# Patient Record
Sex: Female | Born: 1955 | Race: White | Hispanic: No | Marital: Married | Smoking: Former smoker
Health system: Southern US, Community
[De-identification: ages and names within clinical notes are randomized; demographics above are authoritative.]

## PROBLEM LIST (undated history)

## (undated) DIAGNOSIS — F17210 Nicotine dependence, cigarettes, uncomplicated: Secondary | ICD-10-CM

## (undated) DIAGNOSIS — Z5189 Encounter for other specified aftercare: Secondary | ICD-10-CM

## (undated) DIAGNOSIS — D649 Anemia, unspecified: Secondary | ICD-10-CM

## (undated) DIAGNOSIS — N200 Calculus of kidney: Secondary | ICD-10-CM

## (undated) DIAGNOSIS — T7840XA Allergy, unspecified, initial encounter: Secondary | ICD-10-CM

## (undated) DIAGNOSIS — Z87442 Personal history of urinary calculi: Secondary | ICD-10-CM

## (undated) DIAGNOSIS — Z87891 Personal history of nicotine dependence: Secondary | ICD-10-CM

## (undated) DIAGNOSIS — E785 Hyperlipidemia, unspecified: Secondary | ICD-10-CM

## (undated) DIAGNOSIS — E039 Hypothyroidism, unspecified: Secondary | ICD-10-CM

## (undated) HISTORY — DX: Hypothyroidism, unspecified: E03.9

## (undated) HISTORY — PX: CHOLECYSTECTOMY: SHX55

## (undated) HISTORY — PX: TUBAL LIGATION: SHX77

## (undated) HISTORY — DX: Encounter for other specified aftercare: Z51.89

## (undated) HISTORY — DX: Allergy, unspecified, initial encounter: T78.40XA

## (undated) HISTORY — PX: CHOLECYSTECTOMY, LAPAROSCOPIC: SHX56

## (undated) HISTORY — DX: Nicotine dependence, cigarettes, uncomplicated: F17.210

## (undated) HISTORY — PX: ABDOMINAL HYSTERECTOMY: SHX81

## (undated) HISTORY — DX: Calculus of kidney: N20.0

---

## 2015-05-26 DIAGNOSIS — Z79890 Hormone replacement therapy (postmenopausal): Secondary | ICD-10-CM | POA: Insufficient documentation

## 2015-05-26 DIAGNOSIS — N200 Calculus of kidney: Secondary | ICD-10-CM | POA: Insufficient documentation

## 2015-05-26 DIAGNOSIS — E039 Hypothyroidism, unspecified: Secondary | ICD-10-CM | POA: Insufficient documentation

## 2015-05-29 DIAGNOSIS — Z0000 Encounter for general adult medical examination without abnormal findings: Secondary | ICD-10-CM | POA: Insufficient documentation

## 2017-07-07 DIAGNOSIS — R3129 Other microscopic hematuria: Secondary | ICD-10-CM | POA: Diagnosis not present

## 2017-07-07 DIAGNOSIS — Z0000 Encounter for general adult medical examination without abnormal findings: Secondary | ICD-10-CM | POA: Diagnosis not present

## 2018-05-15 ENCOUNTER — Other Ambulatory Visit: Payer: Self-pay | Admitting: Internal Medicine

## 2018-05-15 DIAGNOSIS — Z1389 Encounter for screening for other disorder: Secondary | ICD-10-CM | POA: Diagnosis not present

## 2018-05-15 DIAGNOSIS — F17210 Nicotine dependence, cigarettes, uncomplicated: Secondary | ICD-10-CM | POA: Diagnosis not present

## 2018-05-15 DIAGNOSIS — N200 Calculus of kidney: Secondary | ICD-10-CM | POA: Diagnosis not present

## 2018-05-15 DIAGNOSIS — Z0000 Encounter for general adult medical examination without abnormal findings: Secondary | ICD-10-CM | POA: Diagnosis not present

## 2018-05-15 DIAGNOSIS — Z23 Encounter for immunization: Secondary | ICD-10-CM | POA: Diagnosis not present

## 2018-05-15 DIAGNOSIS — E039 Hypothyroidism, unspecified: Secondary | ICD-10-CM | POA: Diagnosis not present

## 2018-05-15 DIAGNOSIS — Z1231 Encounter for screening mammogram for malignant neoplasm of breast: Secondary | ICD-10-CM

## 2018-05-15 LAB — LIPID PANEL
Cholesterol: 232 — AB (ref 0–200)
HDL: 62 (ref 35–70)
LDL Cholesterol: 131
LDl/HDL Ratio: 3.7
Triglycerides: 189 — AB (ref 40–160)

## 2018-05-15 LAB — BASIC METABOLIC PANEL
BUN: 9 (ref 4–21)
CO2: 27 — AB (ref 13–22)
Chloride: 104 (ref 99–108)
Creatinine: 0.7 (ref 0.5–1.1)
Glucose: 91
Potassium: 4.9 (ref 3.4–5.3)
Sodium: 141 (ref 137–147)

## 2018-05-15 LAB — VITAMIN D 25 HYDROXY (VIT D DEFICIENCY, FRACTURES): Vit D, 25-Hydroxy: 22.3

## 2018-05-15 LAB — CBC: RBC: 4.23 (ref 3.87–5.11)

## 2018-05-15 LAB — CBC AND DIFFERENTIAL
HCT: 40 (ref 36–46)
Hemoglobin: 13.7 (ref 12.0–16.0)
Platelets: 266 (ref 150–399)
WBC: 6.5

## 2018-05-15 LAB — COMPREHENSIVE METABOLIC PANEL
Albumin: 4.5 (ref 3.5–5.0)
Calcium: 10.2 (ref 8.7–10.7)
GFR calc non Af Amer: 88

## 2018-05-15 LAB — TSH: TSH: 0.12 — AB (ref 0.41–5.90)

## 2018-05-15 LAB — HEPATIC FUNCTION PANEL
ALT: 14 (ref 7–35)
AST: 21 (ref 13–35)

## 2018-05-18 ENCOUNTER — Other Ambulatory Visit: Payer: Self-pay | Admitting: Internal Medicine

## 2018-05-18 DIAGNOSIS — Z1231 Encounter for screening mammogram for malignant neoplasm of breast: Secondary | ICD-10-CM

## 2018-06-22 DIAGNOSIS — E039 Hypothyroidism, unspecified: Secondary | ICD-10-CM | POA: Diagnosis not present

## 2018-07-27 ENCOUNTER — Ambulatory Visit
Admission: RE | Admit: 2018-07-27 | Discharge: 2018-07-27 | Disposition: A | Discharge status: Home / Self Care | Payer: BLUE CROSS/BLUE SHIELD | Source: Ambulatory Visit | Attending: Internal Medicine | Admitting: Internal Medicine

## 2018-07-27 DIAGNOSIS — Z1231 Encounter for screening mammogram for malignant neoplasm of breast: Secondary | ICD-10-CM

## 2018-08-31 DIAGNOSIS — E039 Hypothyroidism, unspecified: Secondary | ICD-10-CM | POA: Diagnosis not present

## 2019-05-21 DIAGNOSIS — E559 Vitamin D deficiency, unspecified: Secondary | ICD-10-CM | POA: Diagnosis not present

## 2019-05-21 DIAGNOSIS — Z1389 Encounter for screening for other disorder: Secondary | ICD-10-CM | POA: Diagnosis not present

## 2019-05-21 DIAGNOSIS — Z23 Encounter for immunization: Secondary | ICD-10-CM | POA: Diagnosis not present

## 2019-05-21 DIAGNOSIS — Z131 Encounter for screening for diabetes mellitus: Secondary | ICD-10-CM | POA: Diagnosis not present

## 2019-05-21 DIAGNOSIS — E039 Hypothyroidism, unspecified: Secondary | ICD-10-CM | POA: Diagnosis not present

## 2019-05-21 DIAGNOSIS — Z0000 Encounter for general adult medical examination without abnormal findings: Secondary | ICD-10-CM | POA: Diagnosis not present

## 2019-05-21 DIAGNOSIS — E7800 Pure hypercholesterolemia, unspecified: Secondary | ICD-10-CM | POA: Diagnosis not present

## 2019-10-29 DIAGNOSIS — Z23 Encounter for immunization: Secondary | ICD-10-CM | POA: Diagnosis not present

## 2020-01-03 ENCOUNTER — Ambulatory Visit: Payer: BC Managed Care – PPO | Attending: Internal Medicine

## 2020-01-03 ENCOUNTER — Other Ambulatory Visit: Payer: Self-pay

## 2020-01-03 ENCOUNTER — Ambulatory Visit: Payer: BC Managed Care – PPO

## 2020-01-03 DIAGNOSIS — Z23 Encounter for immunization: Secondary | ICD-10-CM

## 2020-01-03 NOTE — Progress Notes (Signed)
   Covid-19 Vaccination Clinic  Name:  Sandra Hess    MRN: [id number] DOB: 29-Dec-1955  01/03/2020  [name] was observed post Covid-19 immunization for 15 minutes without incident. She was provided with Vaccine Information Sheet and instruction to access the V-Safe system.   [name] was instructed to call 911 with any severe reactions post vaccine: Marland Kitchen Difficulty breathing  . Swelling of face and throat  . A fast heartbeat  . A bad rash all over body  . Dizziness and weakness   Immunizations Administered    Name Date Dose VIS Date Route   Pfizer COVID-19 Vaccine 01/03/2020  1:04 PM 0.3 mL 09/10/2019 Intramuscular   Manufacturer: [employer], [employer]   Lot: [id number]   NDC: [address]-2

## 2020-01-19 ENCOUNTER — Other Ambulatory Visit: Payer: Self-pay | Admitting: Internal Medicine

## 2020-01-19 DIAGNOSIS — K219 Gastro-esophageal reflux disease without esophagitis: Secondary | ICD-10-CM | POA: Diagnosis not present

## 2020-01-19 DIAGNOSIS — R1013 Epigastric pain: Secondary | ICD-10-CM | POA: Diagnosis not present

## 2020-01-21 ENCOUNTER — Other Ambulatory Visit: Payer: Self-pay | Admitting: Internal Medicine

## 2020-01-21 DIAGNOSIS — R1013 Epigastric pain: Secondary | ICD-10-CM

## 2020-01-27 ENCOUNTER — Ambulatory Visit
Admission: RE | Admit: 2020-01-27 | Discharge: 2020-01-27 | Disposition: A | Discharge status: Home / Self Care | Payer: BC Managed Care – PPO | Source: Ambulatory Visit | Attending: Internal Medicine | Admitting: Internal Medicine

## 2020-01-27 DIAGNOSIS — R1013 Epigastric pain: Secondary | ICD-10-CM

## 2020-01-27 DIAGNOSIS — K219 Gastro-esophageal reflux disease without esophagitis: Secondary | ICD-10-CM | POA: Diagnosis not present

## 2020-01-27 DIAGNOSIS — K8020 Calculus of gallbladder without cholecystitis without obstruction: Secondary | ICD-10-CM | POA: Diagnosis not present

## 2020-02-02 ENCOUNTER — Ambulatory Visit: Payer: BC Managed Care – PPO | Attending: Internal Medicine

## 2020-02-02 DIAGNOSIS — Z23 Encounter for immunization: Secondary | ICD-10-CM

## 2020-02-02 NOTE — Progress Notes (Signed)
   Covid-19 Vaccination Clinic  Name:  Sandra Hess    MRN: [id number] DOB: October 15, 1955  02/02/2020  [name] was observed post Covid-19 immunization for 15 minutes without incident. She was provided with Vaccine Information Sheet and instruction to access the V-Safe system.   [name] was instructed to call 911 with any severe reactions post vaccine: Marland Kitchen Difficulty breathing  . Swelling of face and throat  . A fast heartbeat  . A bad rash all over body  . Dizziness and weakness   Immunizations Administered    Name Date Dose VIS Date Route   Pfizer COVID-19 Vaccine 02/02/2020  8:14 AM 0.3 mL 11/24/2018 Intramuscular   Manufacturer: [employer], [employer]   Lot: [id number]   NDC: [address]-7

## 2020-03-03 DIAGNOSIS — R945 Abnormal results of liver function studies: Secondary | ICD-10-CM | POA: Diagnosis not present

## 2020-05-26 DIAGNOSIS — Z131 Encounter for screening for diabetes mellitus: Secondary | ICD-10-CM | POA: Diagnosis not present

## 2020-05-26 DIAGNOSIS — E039 Hypothyroidism, unspecified: Secondary | ICD-10-CM | POA: Diagnosis not present

## 2020-05-26 DIAGNOSIS — Z0000 Encounter for general adult medical examination without abnormal findings: Secondary | ICD-10-CM | POA: Diagnosis not present

## 2020-05-26 DIAGNOSIS — N200 Calculus of kidney: Secondary | ICD-10-CM | POA: Diagnosis not present

## 2020-05-31 DIAGNOSIS — K8020 Calculus of gallbladder without cholecystitis without obstruction: Secondary | ICD-10-CM | POA: Insufficient documentation

## 2021-01-30 IMAGING — US US ABDOMEN COMPLETE
1 series · 14 of 25 positions shown · non-contrast
Comparison: None.

CLINICAL DATA: Epigastric abdominal pain.

EXAM:
ABDOMEN ULTRASOUND COMPLETE

[Series 1: us abdomen complete · 0.23mm/px · 14 of 78 slices shown]
[im 1/78]
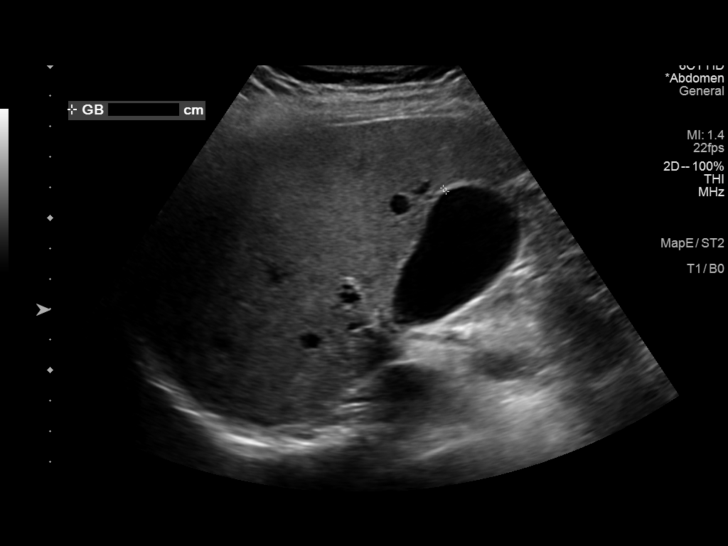
[im 7/78]
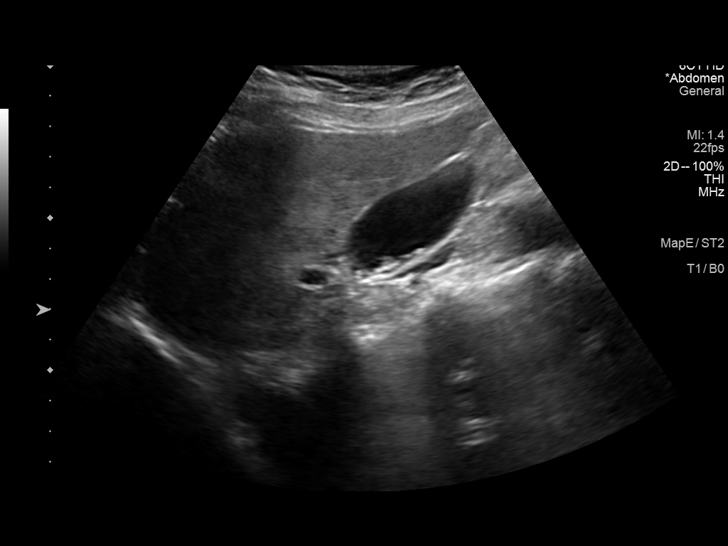
[im 13/78]
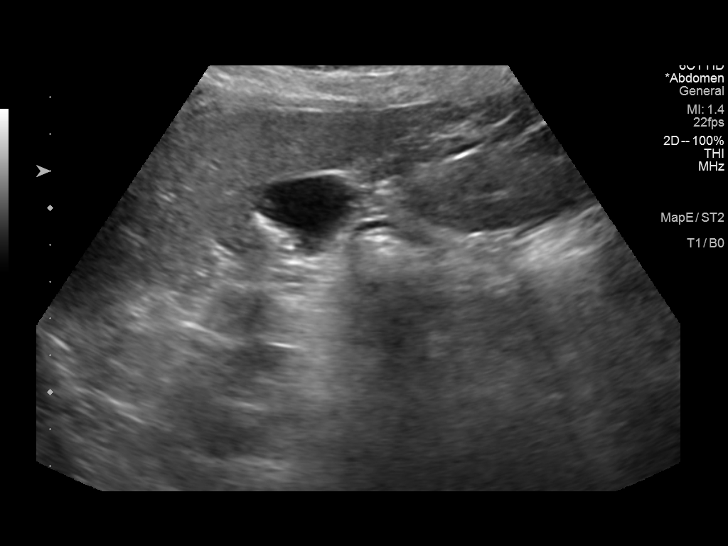
[im 20/78]
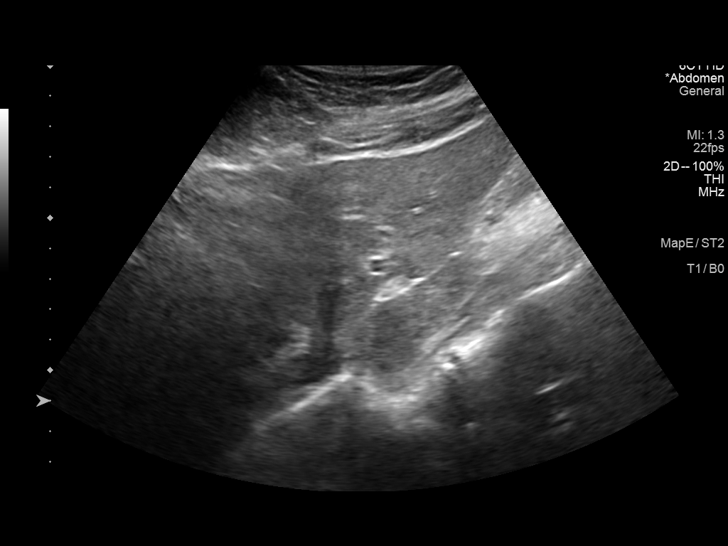
[im 26/78]
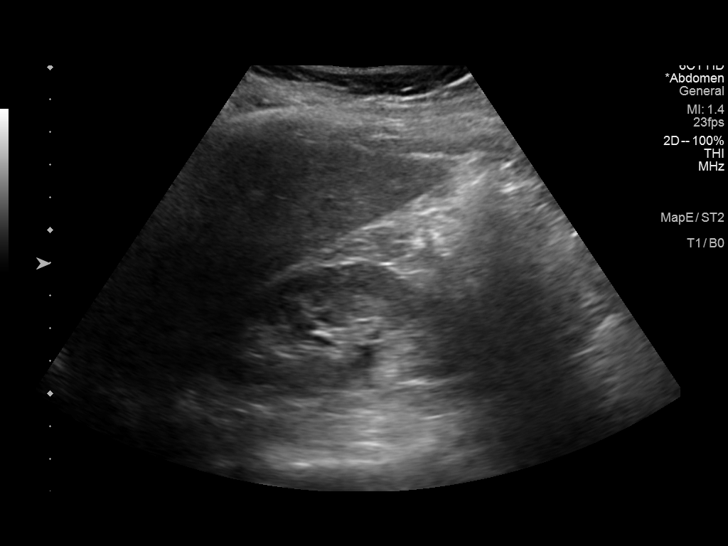
[im 29/78]
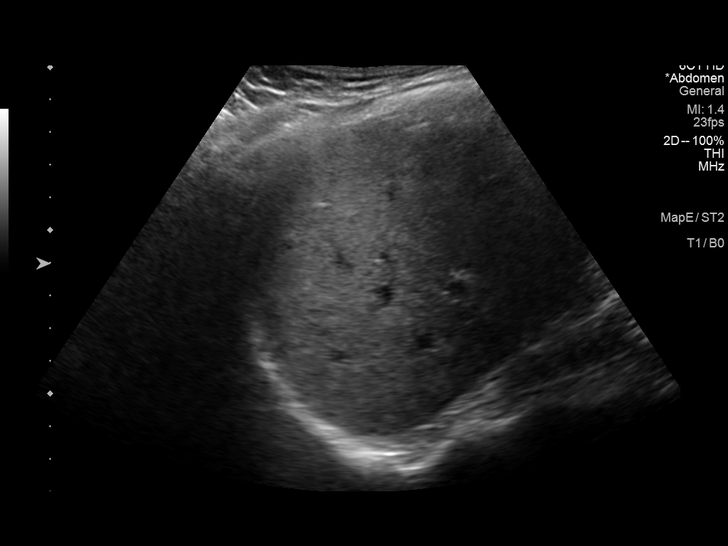
[im 36/78]
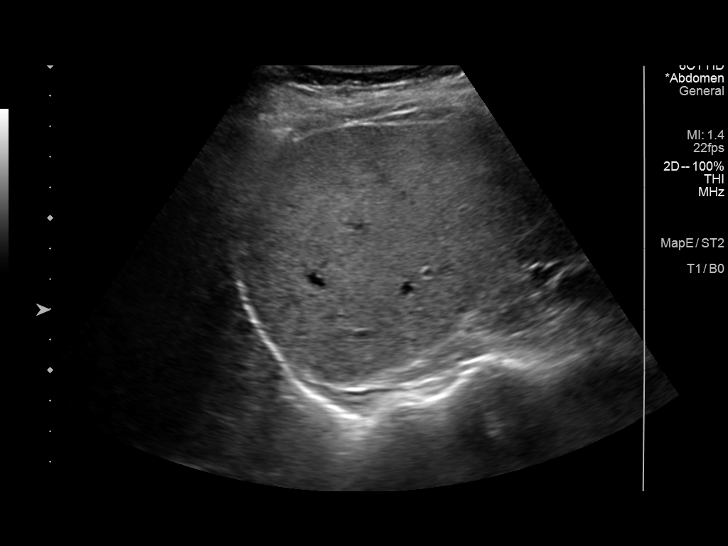
[im 42/78]
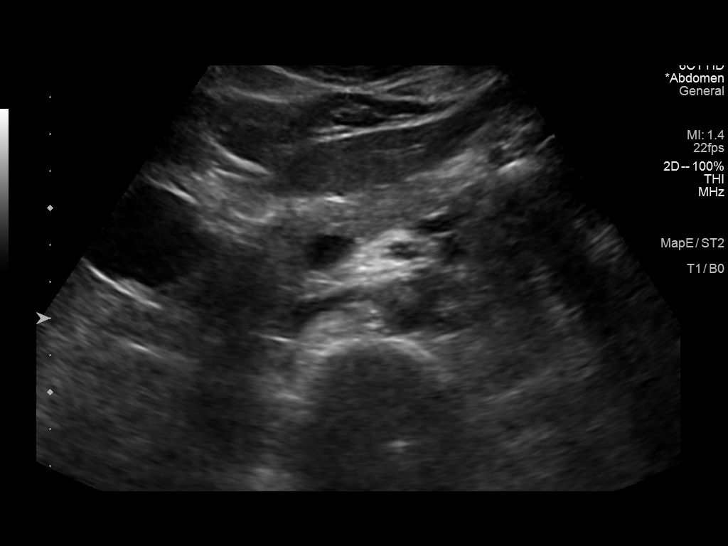
[im 49/78]
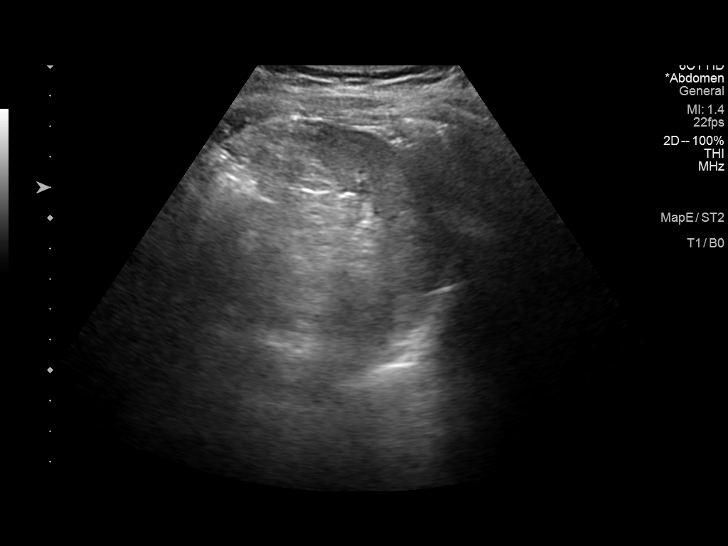
[im 52/78]
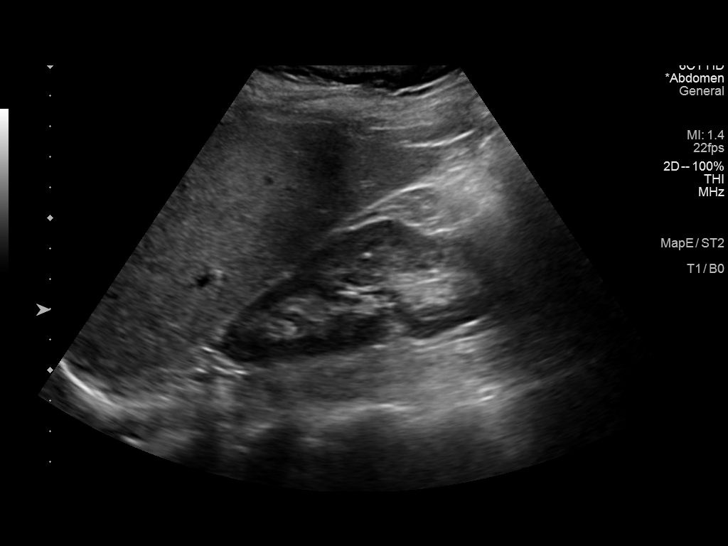
[im 58/78]
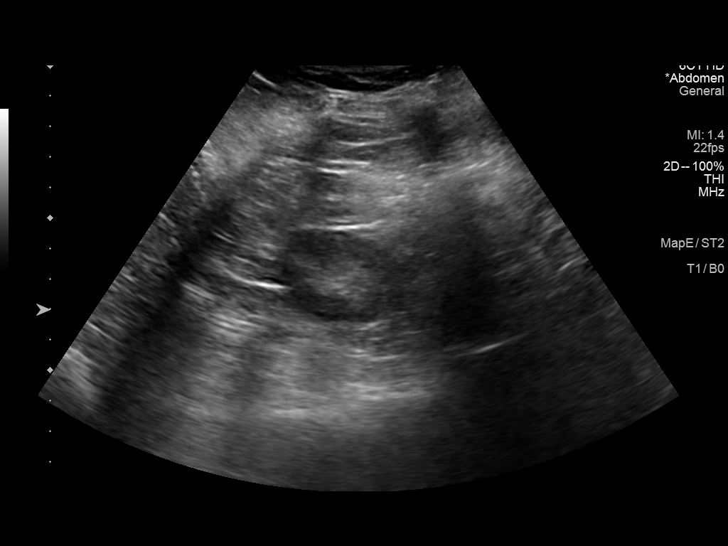
[im 65/78]
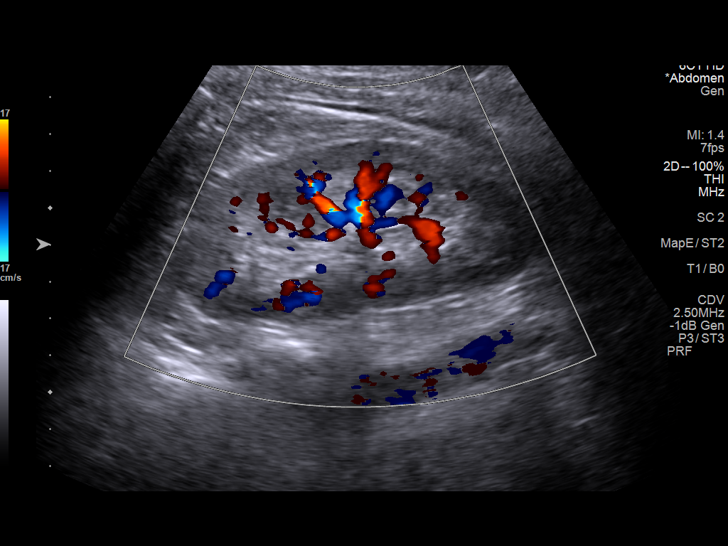
[im 71/78]
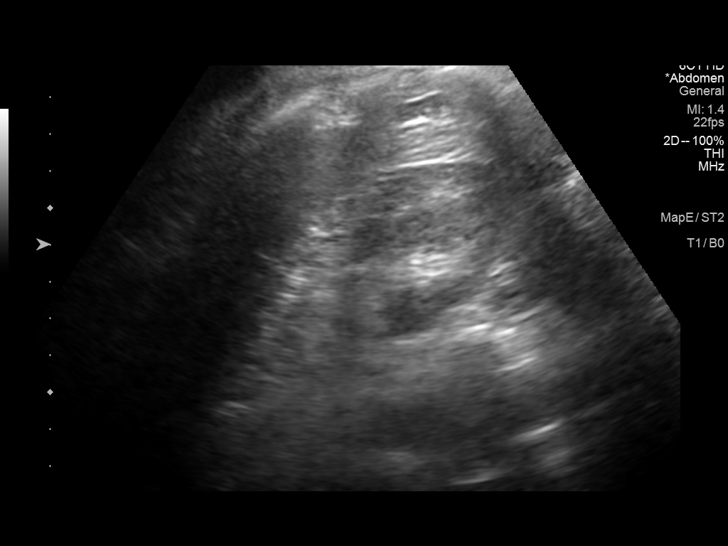
[im 78/78]
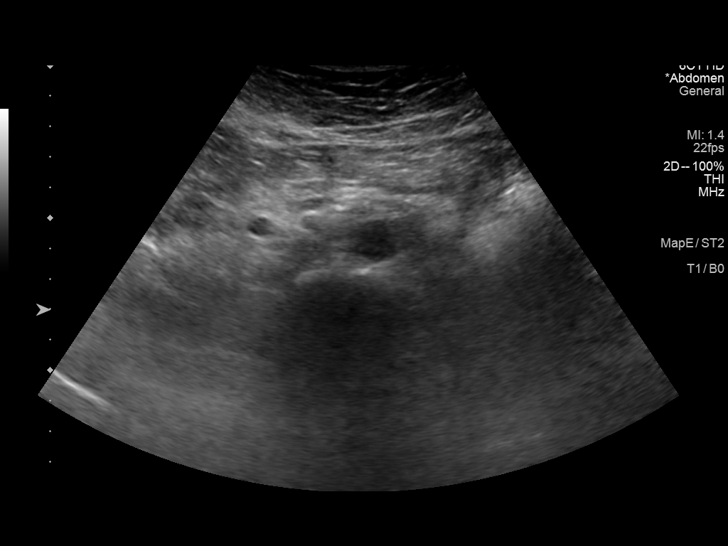

[14 of 25 positions shown; findings below may reference images not displayed]

FINDINGS: Gallbladder: Mild cholelithiasis is noted without gallbladder wall
thickening or pericholecystic fluid. No sonographic Murphy's sign is
noted.

Common bile duct: Diameter: 4 mm which is within normal limits.

Liver: No focal lesion identified. Within normal limits in
parenchymal echogenicity. Portal vein is patent on color Doppler
imaging with normal direction of blood flow towards the liver.

IVC: No abnormality visualized.

Pancreas: Visualized portion unremarkable.

Spleen: Size and appearance within normal limits.

Right Kidney: Length: 9.9 cm. Echogenicity within normal limits. No
mass or hydronephrosis visualized.

Left Kidney: Length: 10.3 cm. Echogenicity within normal limits. No
mass or hydronephrosis visualized.

Abdominal aorta: No aneurysm visualized.

Other findings: None.
IMPRESSION: Mild cholelithiasis without evidence of cholecystitis. No other
abnormality seen in the abdomen.

## 2021-01-30 IMAGING — RF DG UGI W/ HIGH DENSITY W/O KUB
6 series · 14 of 23 positions shown · non-contrast
Comparison: None.

CLINICAL DATA: Epigastric pain.

EXAM:
UPPER GI SERIES WITH KUB
TECHNIQUE: After obtaining a scout radiograph a routine upper GI series was
performed using thin and high density barium.
FLUOROSCOPY TIME:  Fluoroscopy Time:  1 minutes 30 seconds
Radiation Exposure Index (if provided by the fluoroscopic device):
172
Number of Acquired Spot Images: 0

[Series 1: one shot · 0.14mm/px · 3 of 5 slices shown (1 of 3)]
[im 1/5]
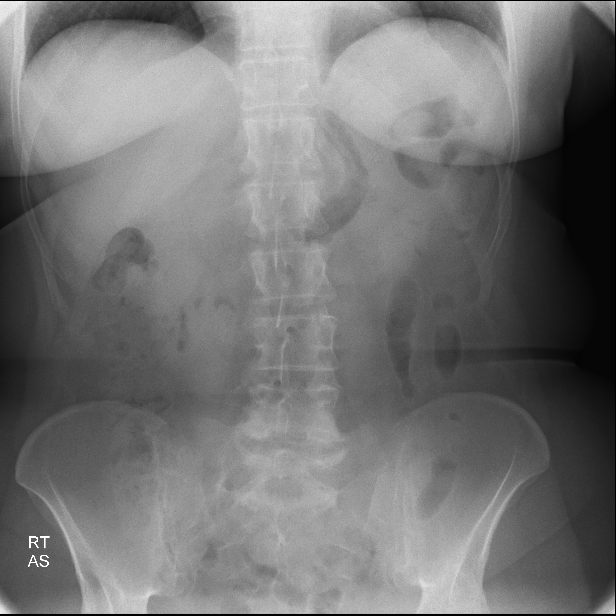
[im 3/5]
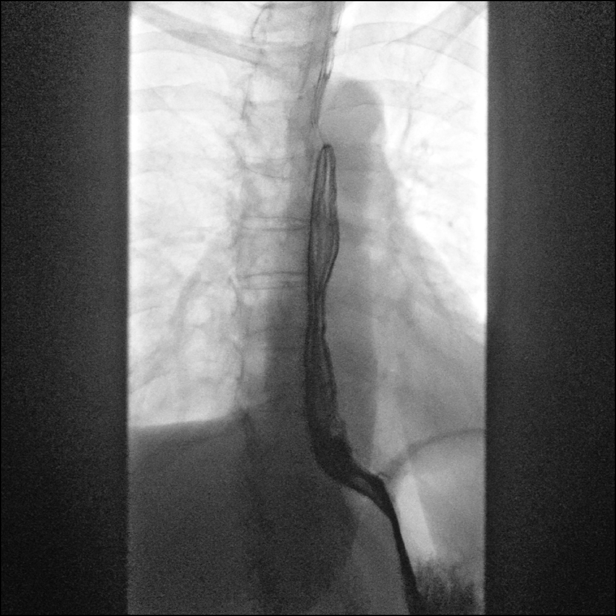
[im 5/5]
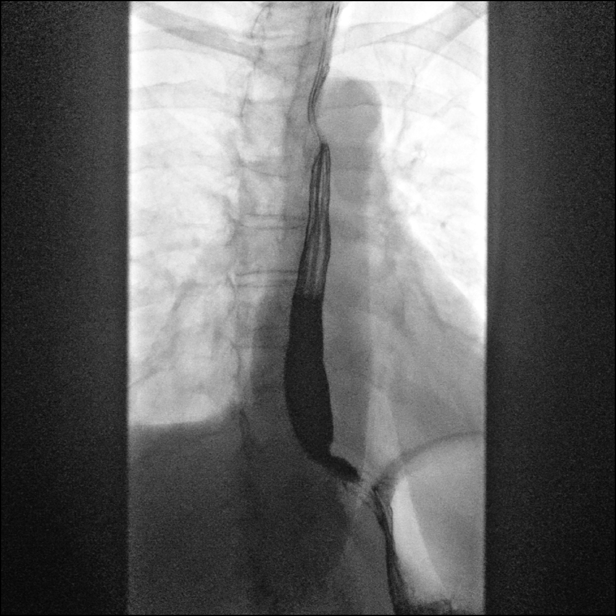

[Series 2: sequence · 2 of 6 frames shown (1 of 3)]
[frame 1/6]
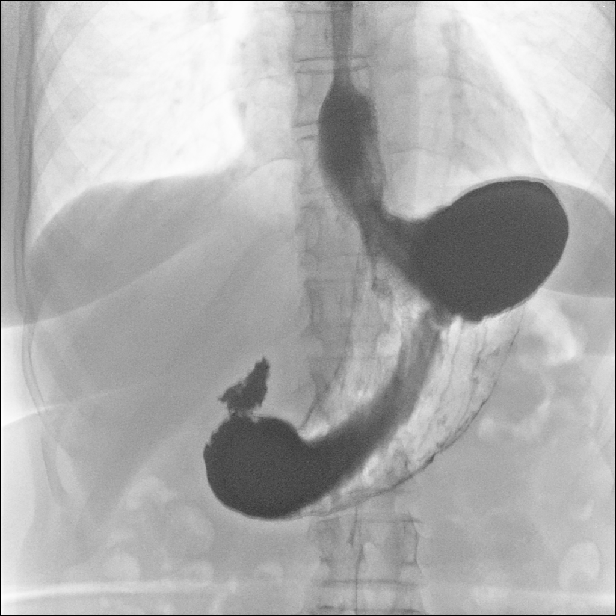
[frame 6/6]
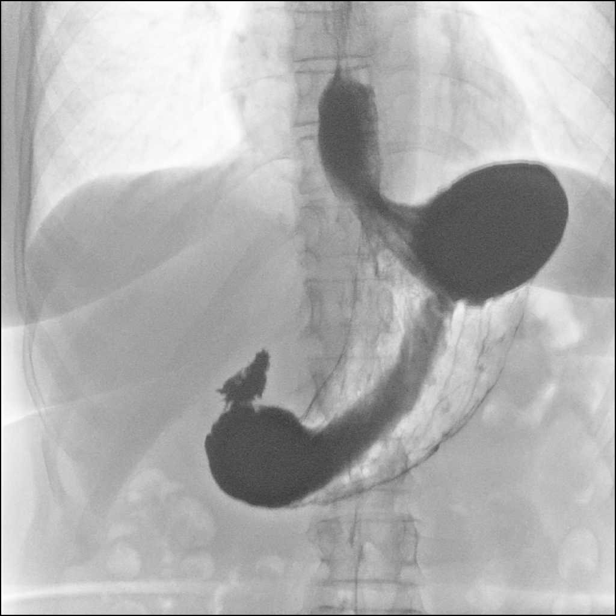

[Series 3: one shot · 3 of 5 slices shown (2 of 3)]
[im 2/5]
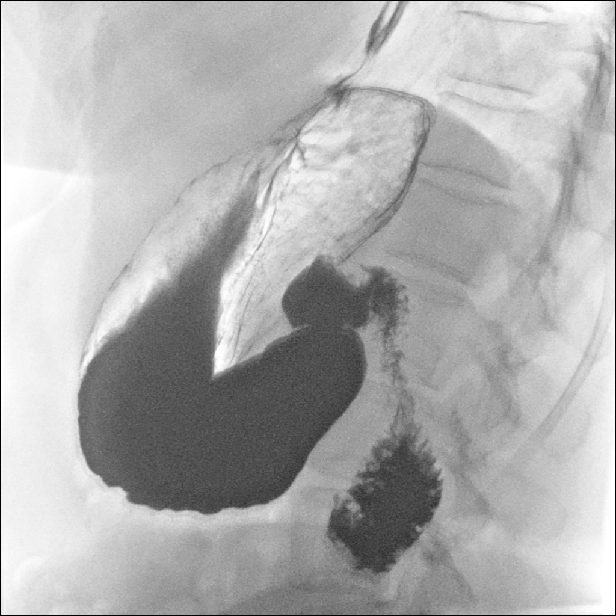
[im 3/5]
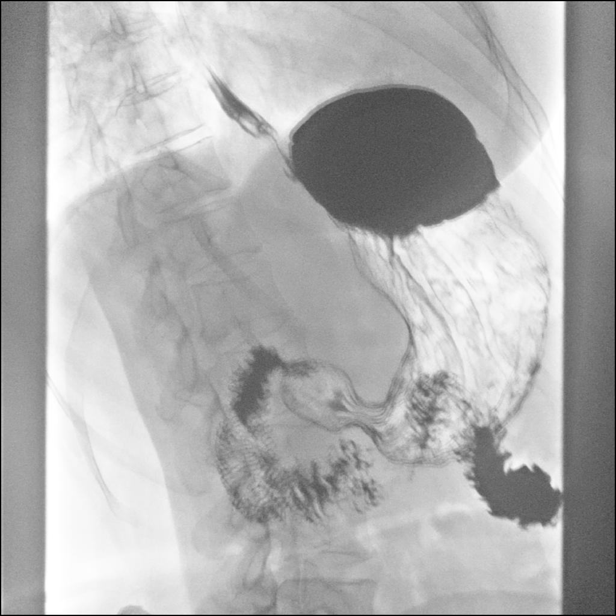
[im 5/5]
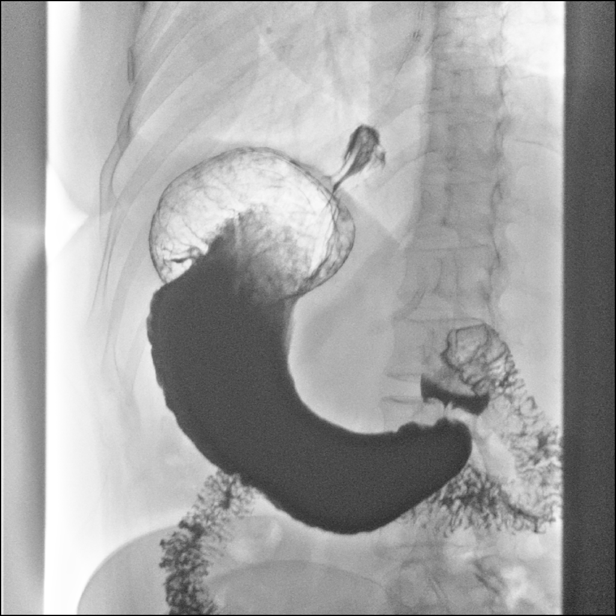

[Series 4: sequence · 2 of 73 frames shown (2 of 3)]
[frame 11/73]
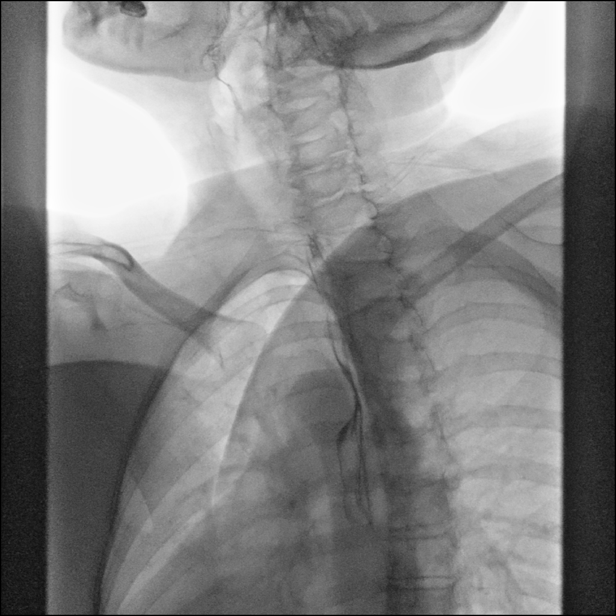
[frame 53/73]
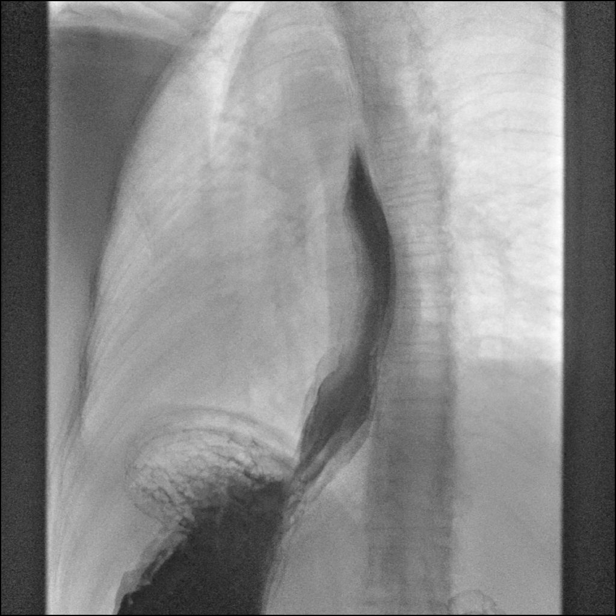

[Series 5: one shot · 2 of 2 slices shown (3 of 3)]
[im 1/2]
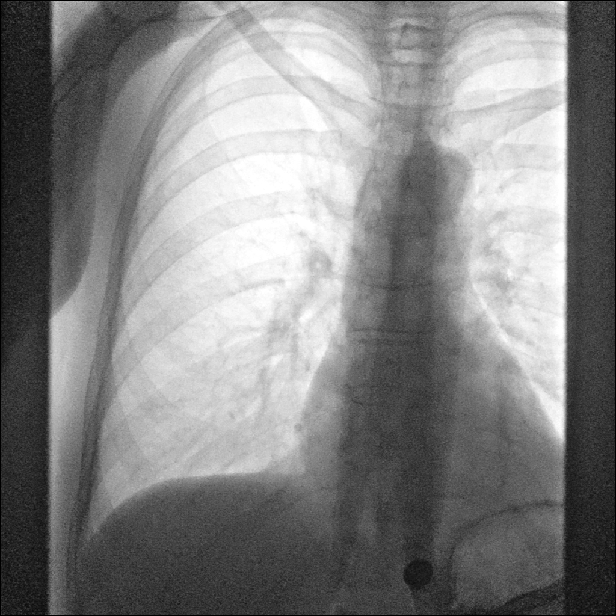
[im 2/2]
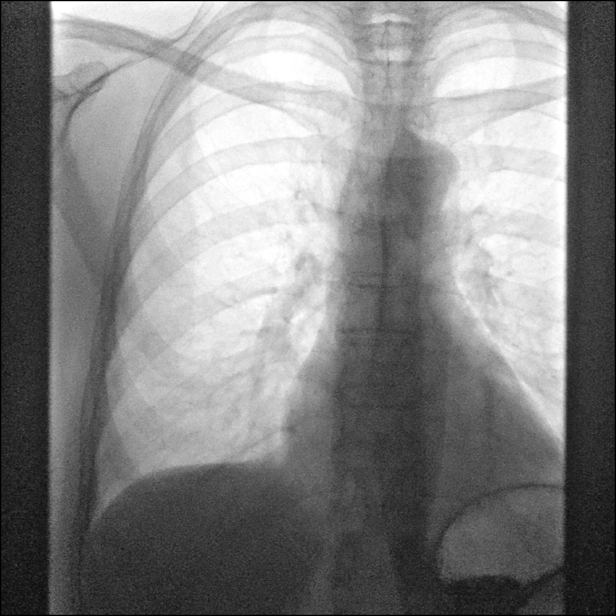

[Series 6: sequence · 2 of 31 frames shown (3 of 3)]
[frame 16/31]
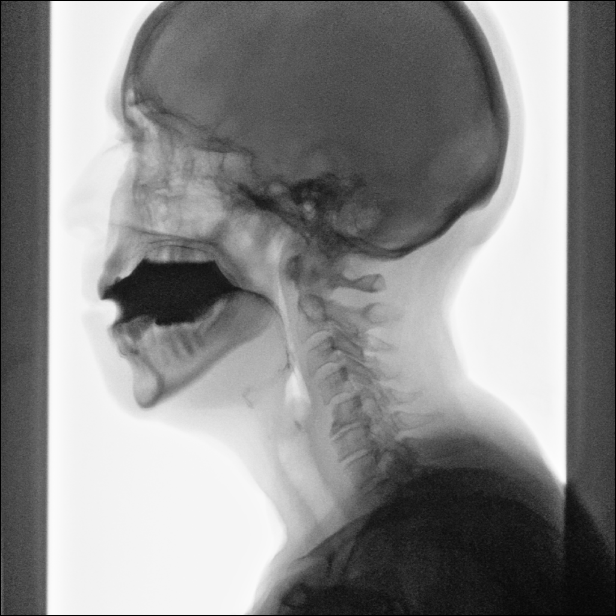
[frame 31/31]
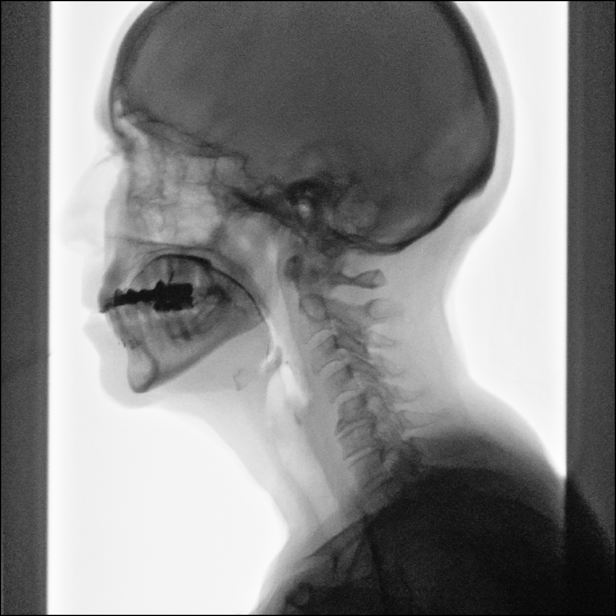

[14 of 23 positions shown; findings below may reference images not displayed]

FINDINGS: The esophagus is patent. There is no stricture or mass identified.
The patient ingested a 13 mm barium tablet which easily passed
through the esophagus and into the stomach.

A small to moderate size hiatal hernia is identified with
spontaneous reflux noted. The stomach, duodenal bulb and C loop are
otherwise unremarkable.

The swallowing mechanism appears within normal limits.
IMPRESSION: 1. Small to moderate size hiatal hernia with spontaneous reflux.
2. No stricture or mass identified.

## 2021-03-13 ENCOUNTER — Encounter: Payer: Self-pay | Admitting: Family Medicine

## 2021-03-13 ENCOUNTER — Ambulatory Visit (INDEPENDENT_AMBULATORY_CARE_PROVIDER_SITE_OTHER): Payer: BC Managed Care – PPO | Admitting: Family Medicine

## 2021-03-13 ENCOUNTER — Other Ambulatory Visit: Payer: Self-pay

## 2021-03-13 VITALS — BP 116/62 | HR 64 | Temp 98.0°F | Resp 16 | Ht 60.0 in | Wt 130.8 lb

## 2021-03-13 DIAGNOSIS — Z7689 Persons encountering health services in other specified circumstances: Secondary | ICD-10-CM

## 2021-03-13 DIAGNOSIS — E039 Hypothyroidism, unspecified: Secondary | ICD-10-CM | POA: Diagnosis not present

## 2021-03-13 DIAGNOSIS — K219 Gastro-esophageal reflux disease without esophagitis: Secondary | ICD-10-CM

## 2021-03-13 DIAGNOSIS — Z79890 Hormone replacement therapy (postmenopausal): Secondary | ICD-10-CM

## 2021-03-13 DIAGNOSIS — K8020 Calculus of gallbladder without cholecystitis without obstruction: Secondary | ICD-10-CM

## 2021-03-13 DIAGNOSIS — E7800 Pure hypercholesterolemia, unspecified: Secondary | ICD-10-CM

## 2021-03-13 DIAGNOSIS — Z720 Tobacco use: Secondary | ICD-10-CM | POA: Diagnosis not present

## 2021-03-13 NOTE — Progress Notes (Signed)
New patient visit   Patient: Sandra Hess   DOB: 01-31-1956   65 y.o. Female  MRN: [id number] Visit Date: 03/13/2021  Today's healthcare provider: Dortha Kern, PA-C   Chief Complaint  Patient presents with   New Patient (Initial Visit)   Subjective    Sandra Hess is a 65 y.o. female who presents today as a new patient to establish care.  HPI  Patient was seen on 05/26/2020 at [facility], Dr. Georgann Housekeeper  Past Medical History:  Diagnosis Date   Allergy    Blood transfusion without reported diagnosis     Family Status  Relation Name Status   Mother  (Not Specified)   Father  (Not Specified)   Family History  Problem Relation Age of Onset   Heart disease Mother    Stroke Mother    Stroke Father    Social History   Socioeconomic History   Marital status: Married    Spouse name: Not on file   Number of children: Not on file   Years of education: Not on file   Highest education level: Not on file  Occupational History   Not on file  Tobacco Use   Smoking status: Every Day    Pack years: 0.00    Types: Cigarettes   Smokeless tobacco: Never  Vaping Use   Vaping Use: Never used  Substance and Sexual Activity   Alcohol use: Yes    Alcohol/week: 1.0 standard drink    Types: 1 Glasses of wine per week   Drug use: Never   Sexual activity: Not on file  Other Topics Concern   Not on file  Social History Narrative   Not on file   Social Determinants of Health   Financial Resource Strain: Not on file  Food Insecurity: Not on file  Transportation Needs: Not on file  Physical Activity: Not on file  Stress: Not on file  Social Connections: Not on file   Outpatient Medications Prior to Visit  Medication Sig   Calcium Carb-Cholecalciferol (CALTRATE 600+D3) 600-800 MG-UNIT TABS Take 1 tablet by mouth daily.   estradiol (ESTRACE) 1 MG tablet Take 1 mg by mouth daily.   levothyroxine (SYNTHROID) 88 MCG tablet Take 88 mcg by mouth  daily before breakfast.   Multiple Vitamins-Minerals (EQL ONE DAILY WOMENS 50+ ADV) TABS Take 1 tablet by mouth daily.   No facility-administered medications prior to visit.   No Known Allergies  Immunization History  Administered Date(s) Administered   PFIZER(Purple Top)SARS-COV-2 Vaccination 01/03/2020, 02/02/2020    Health Maintenance  Topic Date Due   Pneumococcal Vaccine 66-62 Years old (1 - PCV) Never done   HIV Screening  Never done   Hepatitis C Screening  Never done   PAP SMEAR-Modifier  Never done   COLONOSCOPY (Pts 45-33yrs Insurance coverage will need to be confirmed)  Never done   Zoster Vaccines- Shingrix (1 of 2) Never done   COVID-19 Vaccine (3 - Booster for Pfizer series) 07/04/2020   MAMMOGRAM  07/27/2020   INFLUENZA VACCINE  04/30/2021   TETANUS/TDAP  05/15/2028   HPV VACCINES  Aged Out    Patient Care Team: Nihar Klus, Maryjean Morn as PCP - General (Family Medicine)  Review of Systems  Allergic/Immunologic: Positive for environmental allergies.  All other systems reviewed and are negative.    Objective    BP 116/62 (BP Location: Right Arm, Patient Position: Sitting, Cuff Size: Normal)   Pulse 64   Temp  98 F (36.7 C) (Oral)   Resp 16   Ht 5' (1.524 m)   Wt 130 lb 12.8 oz (59.3 kg)   SpO2 100%   BMI 25.55 kg/m  Physical Exam Constitutional:      General: She is not in acute distress.    Appearance: She is well-developed.  HENT:     Head: Normocephalic and atraumatic.     Right Ear: Hearing and tympanic membrane normal.     Left Ear: Hearing and tympanic membrane normal.     Nose: Nose normal.     Mouth/Throat:     Pharynx: Oropharynx is clear.  Eyes:     General: Lids are normal. No scleral icterus.       Right eye: No discharge.        Left eye: No discharge.     Conjunctiva/sclera: Conjunctivae normal.  Cardiovascular:     Rate and Rhythm: Normal rate and regular rhythm.     Pulses: Normal pulses.     Heart sounds: Normal heart  sounds.  Pulmonary:     Effort: Pulmonary effort is normal. No respiratory distress.  Abdominal:     General: Bowel sounds are normal.     Palpations: Abdomen is soft.  Musculoskeletal:        General: Normal range of motion.     Cervical back: Neck supple.  Skin:    Findings: No lesion or rash.  Neurological:     Mental Status: She is alert and oriented to person, place, and time.  Psychiatric:        Speech: Speech normal.        Behavior: Behavior normal.        Thought Content: Thought content normal.     Depression Screen No flowsheet data found. No results found for any visits on 03/13/21.  Assessment & Plan     1. Encounter to establish care Stable general health. Counseled as new patient to practice.  2. Acquired hypothyroidism Presently on Levothyroxine 88 mcg qd. No significant hair loss, intolerance to cold, exophthalmos, tremor, dry skin or brittle nails. Schedule for fasting labs. - CBC with Differential/Platelet; Future - T4; Future - TSH; Future  3. Calculus of gallbladder without cholecystitis without obstruction Documented on abdominal ultrasound of 01-27-20. No significant symptoms. Will check follow up labs. May need surgical consult. - CBC with Differential/Platelet; Future - Comprehensive metabolic panel; Future  4. Tobacco user Counseled regarding quitting. Smoking <1ppd reported.  5. Hypercholesterolemia History of abnormal lipids. Not taking any statins. Recommend low fat diet and recheck labs. - CBC with Differential/Platelet; Future - TSH; Future - Lipid panel; Future  6. Gastroesophageal reflux disease without esophagitis History of some dyspepsia that may have been related to cholelithiasis. Recheck labs and may use OTC H2-blocker. - CBC with Differential/Platelet; Future - Comprehensive metabolic panel; Future  7. Postmenopausal HRT (hormone replacement therapy) Presently on Estradiol 1 mg qd with Caltrate 600 mg qd and Women's  One-A-Day vitamin. Good control of menopausal symptoms. - CBC with Differential/Platelet; Future - Comprehensive metabolic panel; Future   No follow-ups on file.     I, Vollie Aaron, PA-C, have reviewed all documentation for this visit. The documentation on 03/13/21 for the exam, diagnosis, procedures, and orders are all accurate and complete.    Dortha Kern, PA-C  [facility] [phone] (phone) [phone] (fax)  [facility]

## 2021-03-13 NOTE — Patient Instructions (Signed)
Kidney Stones  Kidney stones are solid, rock-like deposits that form inside of the kidneys. The kidneys are a pair of organs that make urine. A kidney stone may form in a kidney and move into other parts of the urinary tract, including the tubes that connect the kidneys to the bladder (ureters), the bladder, and the tube that carries urine out of the body (urethra). As the stone moves through these areas, it can cause intense pain and blockthe flow of urine. Kidney stones are created when high levels of certain minerals are found in the urine. The stones are usually passed out of the body through urination, but insome cases, medical treatment may be needed to remove them. What are the causes? Kidney stones may be caused by: A condition in which certain glands produce too much parathyroid hormone (primary hyperparathyroidism), which causes too much calcium buildup in the blood. A buildup of uric acid crystals in the bladder (hyperuricosuria). Uric acid is a chemical that the body produces when you eat certain foods. It usually exits the body in the urine. Narrowing (stricture) of one or both of the ureters. A kidney blockage that is present at birth (congenital obstruction). Past surgery on the kidney or the ureters, such as gastric bypass surgery. What increases the risk? The following factors may make you more likely to develop this condition: Having had a kidney stone in the past. Having a family history of kidney stones. Not drinking enough water. Eating a diet that is high in protein, salt (sodium), or sugar. Being overweight or obese. What are the signs or symptoms? Symptoms of a kidney stone may include: Pain in the side of the abdomen, right below the ribs (flank pain). Pain usually spreads (radiates) to the groin. Needing to urinate frequently or urgently. Painful urination. Blood in the urine (hematuria). Nausea. Vomiting. Fever and chills. How is this diagnosed? This condition  may be diagnosed based on: Your symptoms and medical history. A physical exam. Blood tests. Urine tests. These may be done before and after the stone passes out of your body through urination. Imaging tests, such as a CT scan, abdominal X-ray, or ultrasound. A procedure to examine the inside of the bladder (cystoscopy). How is this treated? Treatment for kidney stones depends on the size, location, and makeup of the stones. Kidney stones will often pass out of the body through urination. You may need to: Increase your fluid intake to help pass the stone. In some cases, you may be given fluids through an IV and may need to be monitored at the hospital. Take medicine for pain. Make changes in your diet to help prevent kidney stones from coming back. Sometimes, medical procedures are needed to remove a kidney stone. This may involve: A procedure to break up kidney stones using: A focused beam of light (laser therapy). Shock waves (extracorporeal shock wave lithotripsy). Surgery to remove kidney stones. This may be needed if you have severe pain or have stones that block your urinary tract. Follow these instructions at home: Medicines Take over-the-counter and prescription medicines only as told by your health care provider. Ask your health care provider if the medicine prescribed to you requires you to avoid driving or using heavy machinery. Eating and drinking Drink enough fluid to keep your urine pale yellow. You may be instructed to drink at least 8-10 glasses of water each day. This will help you pass the kidney stone. If directed, change your diet. This may include: Limiting how much sodium you  eat. Eating more fruits and vegetables. Limiting how much animal protein--such as red meat, poultry, fish, and eggs--you eat. Follow instructions from your health care provider about eating or drinking restrictions. General instructions Collect urine samples as told by your health care provider.  You may need to collect a urine sample: 24 hours after you pass the stone. 8-12 weeks after passing the kidney stone, and every 6-12 months after that. Strain your urine every time you urinate, for as long as directed. Use the strainer that your health care provider recommends. Do not throw out the kidney stone after passing it. Keep the stone so it can be tested by your health care provider. Testing the makeup of your kidney stone may help prevent you from getting kidney stones in the future. Keep all follow-up visits as told by your health care provider. This is important. You may need follow-up X-rays or ultrasounds to make sure that your stone has passed. How is this prevented? To prevent another kidney stone: Drink enough fluid to keep your urine pale yellow. This is the best way to prevent kidney stones. Eat a healthy diet and follow recommendations from your health care provider about foods to avoid. You may be instructed to eat a low-protein diet. Recommendations vary depending on the type of kidney stone that you have. Maintain a healthy weight. Where to find more information National Kidney Foundation (NKF): www.kidney.org [facility]): www.urologyhealth.org Contact a health care provider if: You have pain that gets worse or does not get better with medicine. Get help right away if: You have a fever or chills. You develop severe pain. You develop new abdominal pain. You faint. You are unable to urinate. Summary Kidney stones are solid, rock-like deposits that form inside of the kidneys. Kidney stones can cause nausea, vomiting, blood in the urine, abdominal pain, and the urge to urinate frequently. Treatment for kidney stones depends on the size, location, and makeup of the stones. Kidney stones will often pass out of the body through urination. Kidney stones can be prevented by drinking enough fluids, eating a healthy diet, and maintaining a healthy weight. This  information is not intended to replace advice given to you by your health care provider. Make sure you discuss any questions you have with your healthcare provider. Document Revised: 01/29/2019 Document Reviewed: 02/02/2019 Elsevier Patient Education  2022 [employer] Cholelithiasis  Cholelithiasis is a disease in which gallstones form in the gallbladder. The gallbladder is an organ that stores bile. Bile is a fluid that helps to digest fats. Gallstones begin as small crystals and can slowly grow into stones. They may cause no symptoms until they block the gallbladder duct, or cystic duct, when the gallbladder tightens (contracts) after food is eaten. This can cause pain and is known as a gallbladderattack, or biliary colic. There are two main types of gallstones: Cholesterol stones. These are the most common type of gallstone. These stones are made of hardened cholesterol and are usually yellow-green in color. Cholesterol is a fat-like substance that is made in the liver. Pigment stones. These are dark in color and are made of a red-yellow substance, called bilirubin,that forms when hemoglobin from red blood cells breaks down. What are the causes? This condition may be caused by an imbalance in the different parts that make bile. This can happen if the bile: Has too much bilirubin. This can happen in certain blood diseases, such as sickle cell anemia. Has too much cholesterol. Does not have enough  bile salts. These salts help the body absorb and digest fats. In some cases, this condition can also be caused by the gallbladder notemptying completely or often enough. This is common during pregnancy. What increases the risk? The following factors may make you more likely to develop this condition: Being female. Having multiple pregnancies. Health care providers sometimes advise removing diseased gallbladders before future pregnancies. Eating a diet that is heavy in fried foods, fat, and refined  carbohydrates, such as white bread and white rice. Being obese. Being older than age 63. Using medicines that contain female hormones (estrogen) for a long time. Losing weight quickly. Having a family history of gallstones. Having certain medical problems, such as: Diabetes mellitus. Cystic fibrosis. Crohn's disease. Cirrhosis or other long-term (chronic) liver disease. Certain blood diseases, such as sickle cell anemia or leukemia. What are the signs or symptoms? In many cases, having gallstones causes no symptoms. When you have gallstones but do not have symptoms, you have silent gallstones. If a gallstone blocks your bile duct, it can cause a gallbladder attack. The main symptom of a gallbladder attack is sudden pain in the upper right part of the abdomen. The pain: Usually comes at night or after eating. Can last for one hour or more. Can spread to your right shoulder, back, or chest. Can feel like indigestion. This is discomfort, burning, or fullness in your upper abdomen. If the bile duct is blocked for more than a few hours, it can cause an infection or inflammation of your gallbladder (cholecystitis), liver, or pancreas. This can cause: Nausea or vomiting. Bloating. Pain in your abdomen that lasts for 5 hours or longer. Tenderness in your upper abdomen, often in the upper right section and under your rib cage. Fever or chills. Skin or the white parts of your eyes turning yellow (jaundice). This usually happens when a stone has blocked bile from passing through the common bile duct. Dark urine or light-colored stools. How is this diagnosed? This condition may be diagnosed based on: A physical exam. Your medical history. Ultrasound. CT scan. MRI. You may also have other tests, including: Blood tests to check for signs of an infection or inflammation. Cholescintigraphy, or HIDA scan. This is a scan of your gallbladder and bile ducts (biliary system) using non-harmful  radioactive material and special cameras that can see the radioactive material. Endoscopic retrograde cholangiopancreatogram. This involves inserting a small tube with a camera on the end (endoscope) through your mouth to look at bile ducts and check for blockages. How is this treated? Treatment for this condition depends on the severity of the condition. Silent gallstones do not need treatment. Treatment may be needed if a blockage causes a gallbladder attack or other symptoms. Treatment may include: Home care, if symptoms are not severe. During a simple gallbladder attack, stop eating and drinking for 12-24 hours (except for water and clear liquids). This helps to "cool down" your gallbladder. After 1 or 2 days, you can start to eat a diet of simple or clear foods, such as broths and crackers. You may also need medicines for pain or nausea or both. If you have cholecystitis and an infection, you will need antibiotics. A hospital stay, if needed for pain control or for cholecystitis with severe infection. Cholecystectomy, or surgery to remove your gallbladder. This is the most common treatment if all other treatments have not worked. Medicines to break up gallstones. These are most effective at treating small gallstones. Medicines may be used for up to 6-12  months. Endoscopic retrograde cholangiopancreatogram. A small basket can be attached to the endoscope and used to capture and remove gallstones, mainly those that are in the common bile duct. Follow these instructions at home: Medicines Take over-the-counter and prescription medicines only as told by your health care provider. If you were prescribed an antibiotic medicine, take it as told by your health care provider. Do not stop taking the antibiotic even if you start to feel better. Ask your health care provider if the medicine prescribed to you requires you to avoid driving or using machinery. Eating and drinking Drink enough fluid to keep  your urine pale yellow. This is important during a gallbladder attack. Water and clear liquids are preferred. Follow a healthy diet. This includes: Reducing fatty foods, such as fried food and foods high in cholesterol. Reducing refined carbohydrates, such as white bread and white rice. Eating more fiber. Aim for foods such as almonds, fruit, and beans. Alcohol use If you drink alcohol: Limit how much you use to: 0-1 drink a day for nonpregnant women. 0-2 drinks a day for men. Be aware of how much alcohol is in your drink. In the [place] one drink equals one 12 oz bottle of beer (355 mL), one 5 oz glass of wine (148 mL), or one 1 oz glass of hard liquor (44 mL). General instructions Do not use any products that contain nicotine or tobacco, such as cigarettes, e-cigarettes, and chewing tobacco. If you need help quitting, ask your health care provider. Maintain a healthy weight. Keep all follow-up visits as told by your health care provider. These may include consultations with a surgeon or specialist. This is important. Where to find more information [employer] and Digestive [employer] Kidney Diseases: [employer] Contact a health care provider if: You think you have had a gallbladder attack. You have been diagnosed with silent gallstones and you develop pain in your abdomen or indigestion. You begin to have attacks more often. You have dark urine or light-colored stools. Get help right away if: You have pain from a gallbladder attack that lasts for more than 2 hours. You have pain in your abdomen that lasts for more than 5 hours or is getting worse. You have a fever or chills. You have nausea and vomiting that do not go away. You develop jaundice. Summary Cholelithiasis is a disease in which gallstones form in the gallbladder. This condition may be caused by an imbalance in the different parts that make bile. This can happen if your bile has too much bilirubin or  cholesterol, or does not have enough bile salts. Treatment for gallstones depends on the severity of the condition. Silent gallstones do not need treatment. If gallstones cause a gallbladder attack or other symptoms, treatment usually involves not eating or drinking anything. Treatment may also include pain medicines and antibiotics, and it sometimes includes a hospital stay. Surgery to remove the gallbladder is common if all other treatments have not worked. This information is not intended to replace advice given to you by your health care provider. Make sure you discuss any questions you have with your healthcare provider. Document Revised: 08/09/2019 Document Reviewed: 08/09/2019 Elsevier Patient Education  2022 [employer].

## 2021-04-05 ENCOUNTER — Ambulatory Visit: Payer: BC Managed Care – PPO | Admitting: Adult Health

## 2021-05-11 ENCOUNTER — Encounter: Payer: Self-pay | Admitting: Family Medicine

## 2021-08-03 ENCOUNTER — Encounter: Payer: BC Managed Care – PPO | Admitting: Family Medicine

## 2021-09-30 DIAGNOSIS — N200 Calculus of kidney: Secondary | ICD-10-CM

## 2021-09-30 HISTORY — DX: Calculus of kidney: N20.0

## 2021-11-14 ENCOUNTER — Ambulatory Visit: Payer: BC Managed Care – PPO | Admitting: Adult Health

## 2021-12-31 ENCOUNTER — Other Ambulatory Visit: Payer: Self-pay | Admitting: Family Medicine

## 2021-12-31 DIAGNOSIS — Z720 Tobacco use: Secondary | ICD-10-CM | POA: Diagnosis not present

## 2021-12-31 DIAGNOSIS — E7800 Pure hypercholesterolemia, unspecified: Secondary | ICD-10-CM | POA: Diagnosis not present

## 2021-12-31 DIAGNOSIS — Z1231 Encounter for screening mammogram for malignant neoplasm of breast: Secondary | ICD-10-CM

## 2021-12-31 DIAGNOSIS — Z131 Encounter for screening for diabetes mellitus: Secondary | ICD-10-CM | POA: Diagnosis not present

## 2021-12-31 DIAGNOSIS — Z0000 Encounter for general adult medical examination without abnormal findings: Secondary | ICD-10-CM | POA: Diagnosis not present

## 2021-12-31 DIAGNOSIS — E039 Hypothyroidism, unspecified: Secondary | ICD-10-CM | POA: Diagnosis not present

## 2021-12-31 DIAGNOSIS — K219 Gastro-esophageal reflux disease without esophagitis: Secondary | ICD-10-CM | POA: Diagnosis not present

## 2022-01-07 DIAGNOSIS — Z1211 Encounter for screening for malignant neoplasm of colon: Secondary | ICD-10-CM | POA: Diagnosis not present

## 2022-01-14 LAB — EXTERNAL GENERIC LAB PROCEDURE: COLOGUARD: POSITIVE — AB

## 2022-01-14 LAB — COLOGUARD: COLOGUARD: POSITIVE — AB

## 2022-01-24 ENCOUNTER — Ambulatory Visit: Payer: BC Managed Care – PPO | Admitting: Adult Health

## 2022-02-05 ENCOUNTER — Ambulatory Visit
Admission: RE | Admit: 2022-02-05 | Discharge: 2022-02-05 | Disposition: A | Discharge status: Home / Self Care | Payer: BC Managed Care – PPO | Source: Ambulatory Visit | Attending: Family Medicine | Admitting: Family Medicine

## 2022-02-05 DIAGNOSIS — Z1231 Encounter for screening mammogram for malignant neoplasm of breast: Secondary | ICD-10-CM | POA: Insufficient documentation

## 2022-02-11 ENCOUNTER — Other Ambulatory Visit: Payer: Self-pay | Admitting: Family Medicine

## 2022-02-11 DIAGNOSIS — R928 Other abnormal and inconclusive findings on diagnostic imaging of breast: Secondary | ICD-10-CM

## 2022-02-11 DIAGNOSIS — N63 Unspecified lump in unspecified breast: Secondary | ICD-10-CM

## 2022-03-01 ENCOUNTER — Ambulatory Visit
Admission: RE | Admit: 2022-03-01 | Discharge: 2022-03-01 | Disposition: A | Discharge status: Home / Self Care | Payer: BLUE CROSS/BLUE SHIELD | Source: Ambulatory Visit | Attending: Family Medicine | Admitting: Family Medicine

## 2022-03-01 DIAGNOSIS — R928 Other abnormal and inconclusive findings on diagnostic imaging of breast: Secondary | ICD-10-CM | POA: Insufficient documentation

## 2022-03-01 DIAGNOSIS — N6001 Solitary cyst of right breast: Secondary | ICD-10-CM | POA: Diagnosis not present

## 2022-03-01 DIAGNOSIS — N63 Unspecified lump in unspecified breast: Secondary | ICD-10-CM

## 2022-07-21 ENCOUNTER — Other Ambulatory Visit: Payer: Self-pay

## 2022-07-21 ENCOUNTER — Emergency Department: Payer: BC Managed Care – PPO

## 2022-07-21 DIAGNOSIS — K8020 Calculus of gallbladder without cholecystitis without obstruction: Secondary | ICD-10-CM | POA: Insufficient documentation

## 2022-07-21 DIAGNOSIS — R1031 Right lower quadrant pain: Secondary | ICD-10-CM | POA: Diagnosis not present

## 2022-07-21 DIAGNOSIS — K8050 Calculus of bile duct without cholangitis or cholecystitis without obstruction: Secondary | ICD-10-CM | POA: Diagnosis not present

## 2022-07-21 DIAGNOSIS — R1011 Right upper quadrant pain: Secondary | ICD-10-CM | POA: Diagnosis not present

## 2022-07-21 DIAGNOSIS — D72829 Elevated white blood cell count, unspecified: Secondary | ICD-10-CM | POA: Diagnosis not present

## 2022-07-21 DIAGNOSIS — K760 Fatty (change of) liver, not elsewhere classified: Secondary | ICD-10-CM | POA: Diagnosis not present

## 2022-07-21 DIAGNOSIS — R1084 Generalized abdominal pain: Secondary | ICD-10-CM | POA: Diagnosis not present

## 2022-07-21 DIAGNOSIS — R1111 Vomiting without nausea: Secondary | ICD-10-CM | POA: Diagnosis not present

## 2022-07-21 LAB — COMPREHENSIVE METABOLIC PANEL
ALT: 70 U/L — ABNORMAL HIGH (ref 0–44)
AST: 185 U/L — ABNORMAL HIGH (ref 15–41)
Albumin: 4.2 g/dL (ref 3.5–5.0)
Alkaline Phosphatase: 85 U/L (ref 38–126)
Anion gap: 10 (ref 5–15)
BUN: 11 mg/dL (ref 8–23)
CO2: 22 mmol/L (ref 22–32)
Calcium: 9.1 mg/dL (ref 8.9–10.3)
Chloride: 109 mmol/L (ref 98–111)
Creatinine, Ser: 0.7 mg/dL (ref 0.44–1.00)
GFR, Estimated: >60 mL/min (ref >60)
Glucose, Bld: 119 mg/dL — ABNORMAL HIGH (ref 70–99)
Potassium: 3.9 mmol/L (ref 3.5–5.1)
Sodium: 141 mmol/L (ref 135–145)
Total Bilirubin: 1 mg/dL (ref 0.3–1.2)
Total Protein: 7.3 g/dL (ref 6.5–8.1)

## 2022-07-21 LAB — CBC
HCT: 40.8 % (ref 36.0–46.0)
Hemoglobin: 13.8 g/dL (ref 12.0–15.0)
MCH: 31.6 pg (ref 26.0–34.0)
MCHC: 33.8 g/dL (ref 30.0–36.0)
MCV: 93.4 fL (ref 80.0–100.0)
Platelets: 299 10*3/uL (ref 150–400)
RBC: 4.37 MIL/uL (ref 3.87–5.11)
RDW: 13.2 % (ref 11.5–15.5)
WBC: 13.7 10*3/uL — ABNORMAL HIGH (ref 4.0–10.5)
nRBC: 0 % (ref 0.0–0.2)

## 2022-07-21 LAB — LIPASE, BLOOD: Lipase: 30 U/L (ref 11–51)

## 2022-07-21 NOTE — ED Provider Triage Note (Signed)
Emergency Medicine Provider Triage Evaluation Note  Sandra Hess, a 66 y.o. female  was evaluated in triage.  Pt complains of epigastric pain and vomiting.  Patient presents to the ED via EMS from home.  Has a history of gallstones, reports similar symptoms a year ago when she was initially diagnosed.  She presents to the ED without any acute pain at this time, noting symptoms resolve in transit.  Review of Systems  Positive: Abd pain Negative: FCS  Physical Exam  There were no vitals taken for this visit. Gen:   Awake, no distress  NAD Resp:  Normal effort CTA MSK:   Moves extremities without difficulty  Other:  Soft, nontender  Medical Decision Making  Medically screening exam initiated at 6:54 PM.  Appropriate orders placed.  Sandra Hess was informed that the remainder of the evaluation will be completed by another provider, this initial triage assessment does not replace that evaluation, and the importance of remaining in the ED until their evaluation is complete.  Patient to the ED for evaluation of epigastric abdominal pain with associated nausea vomiting, with a history of gallstones.   Sandra Needles, PA-C 07/21/22 1857

## 2022-07-21 NOTE — ED Triage Notes (Signed)
Pt to ED via [facility] from home. Pt reports abdominal pain and vomiting that started around 1pm. Pt reports no relief of pain with vomiting. Pt reports hx of gallstones with similar symptoms.   EMS VS: 120/84 64HR  100% RA 117 CBG  18 RR

## 2022-07-22 ENCOUNTER — Emergency Department
Admission: EM | Admit: 2022-07-22 | Discharge: 2022-07-22 | Disposition: A | Discharge status: Home / Self Care | Payer: BC Managed Care – PPO | Attending: Emergency Medicine | Admitting: Emergency Medicine

## 2022-07-22 DIAGNOSIS — K8020 Calculus of gallbladder without cholecystitis without obstruction: Secondary | ICD-10-CM

## 2022-07-22 DIAGNOSIS — K8050 Calculus of bile duct without cholangitis or cholecystitis without obstruction: Secondary | ICD-10-CM

## 2022-07-22 MED ORDER — ONDANSETRON 4 MG PO TBDP: 4.0000 mg | ORAL_TABLET | Freq: Three times a day (TID) | ORAL | 0 refills | Status: DC | PRN

## 2022-07-22 NOTE — ED Notes (Signed)
Pt DC to home. Dc instructions reviewed with all questions answered. Pt verbalizes understanding. Pt ambulatory out of dept with steady gait with support person

## 2022-07-22 NOTE — ED Provider Notes (Signed)
[facility] Provider Note    Event Date/Time   First MD Initiated Contact with Patient 07/22/22 0110     (approximate)   History   Abdominal Pain   HPI  Sandra Hess is a 66 y.o. female who presents to the ED for evaluation of Abdominal Pain   Patient presents to the ED for evaluation of epigastric pain.  She reports knowing that she has gallstones and has been dealing with them intermittently, but tonight had more persistent pain, which is why she came in.  By the time I see her, she has waited a couple hours and reports feeling much better and has no complaints.  Has not seen a surgeon as an outpatient.  No complaints   Physical Exam   Triage Vital Signs: ED Triage Vitals [07/21/22 1856]  Enc Vitals Group     BP 118/62     Pulse Rate 76     Resp 18     Temp 98.2 F (36.8 C)     Temp Source Oral     SpO2 95 %     Weight      Height      Head Circumference      Peak Flow      Pain Score 5     Pain Loc      Pain Edu?      Excl. in [place]?     Most recent vital signs: Vitals:   07/22/22 0043 07/22/22 0136  BP: 130/68 131/64  Pulse: 60 76  Resp: 18 18  Temp: 98.4 F (36.9 C) 98.2 F (36.8 C)  SpO2: 98% 97%    General: Awake, no distress.  CV:  Good peripheral perfusion.  Resp:  Normal effort.  Abd:  No distention.  Soft and benign throughout MSK:  No deformity noted.  Neuro:  No focal deficits appreciated. Other:     ED Results / Procedures / Treatments   Labs (all labs ordered are listed, but only abnormal results are displayed) Labs Reviewed  COMPREHENSIVE METABOLIC PANEL - Abnormal; Notable for the following components:      Result Value   Glucose, Bld 119 (*)    AST 185 (*)    ALT 70 (*)    All other components within normal limits  CBC - Abnormal; Notable for the following components:   WBC 13.7 (*)    All other components within normal limits  LIPASE, BLOOD  URINALYSIS, ROUTINE W REFLEX MICROSCOPIC     EKG   RADIOLOGY RUQ ultrasound interpreted by me with cholelithiasis without signs of cholecystitis.  Official radiology report(s): US Abdomen Limited RUQ (LIVER/GB)  Result Date: 07/21/2022 CLINICAL DATA:  Right upper quadrant abdominal pain EXAM: ULTRASOUND ABDOMEN LIMITED RIGHT UPPER QUADRANT COMPARISON:  01/27/2020 FINDINGS: Gallbladder: Multiple layering gallstones are seen within the gallbladder. The gallbladder, however, is not distended, there is no gallbladder wall thickening, and no pericholecystic fluid is identified. A tiny focus of focal fatty sparing is likely present along the gallbladder fossa. The sonographic Percell Miller sign is reportedly negative. Common bile duct: Diameter: 7 mm in proximal diameter Liver: Mild diffused increase parenchymal echogenicity likely represents mild hepatic steatosis. No focal intrahepatic mass identified. No intrahepatic biliary ductal dilation. Portal vein is patent on color Doppler imaging with normal direction of blood flow towards the liver. Other: None. IMPRESSION: 1. Cholelithiasis without sonographic evidence of acute cholecystitis. 2. Mild hepatic steatosis. Electronically Signed   By: Linwood Dibbles.D.  On: 07/21/2022 19:49    PROCEDURES and INTERVENTIONS:  Procedures  Medications - No data to display   IMPRESSION / MDM / [place] / ED COURSE  I reviewed the triage vital signs and the nursing notes.  Differential diagnosis includes, but is not limited to, cholelithiasis, cholecystitis, choledocholithiasis, pancreatitis.  Gastritis.  {Patient presents with symptoms of an acute illness or injury that is potentially life-threatening.  66 year old woman presents to the ED with evidence of biliary colic suitable for outpatient management surgical follow-up for her gallstones.  Pain is resolved and she look systemically well with benign exam.  Slight LFT derangements are noted on her metabolic panel, but normal alk phos and  bilirubin.  Doubt choledocholithiasis.  Normal lipase.  Mild leukocytosis is noted, but doubt infectious etiology of her symptoms.  RUQ ultrasound is reassuring with just gallstones.  She is asymptomatic.  We will discharge with surgical referral, Zofran and return precautions.      FINAL CLINICAL IMPRESSION(S) / ED DIAGNOSES   Final diagnoses:  Biliary colic  Calculus of gallbladder without cholecystitis without obstruction     Rx / DC Orders   ED Discharge Orders          Ordered    ondansetron (ZOFRAN-ODT) 4 MG disintegrating tablet  Every 8 hours PRN        07/22/22 0130             Note:  This document was prepared using Dragon voice recognition software and may include unintentional dictation errors.   Vladimir Crofts, MD 07/22/22 [id number]

## 2022-07-24 ENCOUNTER — Telehealth: Payer: Self-pay | Admitting: Surgery

## 2022-07-24 ENCOUNTER — Other Ambulatory Visit: Payer: Self-pay

## 2022-07-24 ENCOUNTER — Encounter: Payer: Self-pay | Admitting: Surgery

## 2022-07-24 ENCOUNTER — Ambulatory Visit (INDEPENDENT_AMBULATORY_CARE_PROVIDER_SITE_OTHER): Payer: BC Managed Care – PPO | Admitting: Surgery

## 2022-07-24 VITALS — BP 145/82 | HR 67 | Temp 98.3°F | Ht 61.0 in | Wt 127.0 lb

## 2022-07-24 DIAGNOSIS — K8020 Calculus of gallbladder without cholecystitis without obstruction: Secondary | ICD-10-CM | POA: Diagnosis not present

## 2022-07-24 NOTE — Patient Instructions (Addendum)
You have requested to have your gallbladder removed. This will be done at [facility] with Dr. Dahlia Byes.  You will most likely be out of work 1-2 weeks for this surgery.  If you have FMLA or disability paperwork that needs filled out you may drop this off at our office or this can be faxed to [phone].  You will return after your post-op appointment with a lifting restriction for approximately 4 more weeks.  You will be able to eat anything you would like to following surgery. But, start by eating a bland diet and advance this as tolerated. The Gallbladder diet is below, please go as closely by this diet as possible prior to surgery to avoid any further attacks.  Please see the (blue)pre-care form that you have been given today. Our surgery scheduler will call you to verify surgery date and to go over information.   If you have any questions, please call our office.  Laparoscopic Cholecystectomy Laparoscopic cholecystectomy is surgery to remove the gallbladder. The gallbladder is located in the upper right part of the abdomen, behind the liver. It is a storage sac for bile, which is produced in the liver. Bile aids in the digestion and absorption of fats. Cholecystectomy is often done for inflammation of the gallbladder (cholecystitis). This condition is usually caused by a buildup of gallstones (cholelithiasis) in the gallbladder. Gallstones can block the flow of bile, and that can result in inflammation and pain. In severe cases, emergency surgery may be required. If emergency surgery is not required, you will have time to prepare for the procedure. Laparoscopic surgery is an alternative to open surgery. Laparoscopic surgery has a shorter recovery time. Your common bile duct may also need to be examined during the procedure. If stones are found in the common bile duct, they may be removed. LET [facility] CARE PROVIDER KNOW ABOUT: Any allergies you have. All medicines you are taking,  including vitamins, herbs, eye drops, creams, and over-the-counter medicines. Previous problems you or members of your family have had with the use of anesthetics. Any blood disorders you have. Previous surgeries you have had.  Any medical conditions you have. RISKS AND COMPLICATIONS Generally, this is a safe procedure. However, problems may occur, including: Infection. Bleeding. Allergic reactions to medicines. Damage to other structures or organs. A stone remaining in the common bile duct. A bile leak from the cyst duct that is clipped when your gallbladder is removed. The need to convert to open surgery, which requires a larger incision in the abdomen. This may be necessary if your surgeon thinks that it is not safe to continue with a laparoscopic procedure. BEFORE THE PROCEDURE Ask your health care provider about: Changing or stopping your regular medicines. This is especially important if you are taking diabetes medicines or blood thinners. Taking medicines such as aspirin and ibuprofen. These medicines can thin your blood. Do not take these medicines before your procedure if your health care provider instructs you not to. Follow instructions from your health care provider about eating or drinking restrictions. Let your health care provider know if you develop a cold or an infection before surgery. Plan to have someone take you home after the procedure. Ask your health care provider how your surgical site will be marked or identified. You may be given antibiotic medicine to help prevent infection. PROCEDURE To reduce your risk of infection: Your health care team will wash or sanitize their hands. Your skin will be washed with soap. An IV  tube may be inserted into one of your veins. You will be given a medicine to make you fall asleep (general anesthetic). A breathing tube will be placed in your mouth. The surgeon will make several small cuts (incisions) in your abdomen. A thin,  lighted tube (laparoscope) that has a tiny camera on the end will be inserted through one of the small incisions. The camera on the laparoscope will send a picture to a TV screen (monitor) in the operating room. This will give the surgeon a good view inside your abdomen. A gas will be pumped into your abdomen. This will expand your abdomen to give the surgeon more room to perform the surgery. Other tools that are needed for the procedure will be inserted through the other incisions. The gallbladder will be removed through one of the incisions. After your gallbladder has been removed, the incisions will be closed with stitches (sutures), staples, or skin glue. Your incisions may be covered with a bandage (dressing). The procedure may vary among health care providers and hospitals. AFTER THE PROCEDURE Your blood pressure, heart rate, breathing rate, and blood oxygen level will be monitored often until the medicines you were given have worn off. You will be given medicines as needed to control your pain.   This information is not intended to replace advice given to you by your health care provider. Make sure you discuss any questions you have with your health care provider.   Document Released: 09/16/2005 Document Revised: 06/07/2015 Document Reviewed: 04/28/2013 Elsevier Interactive Patient Education 2016 Else[employer]   Low-Fat Diet for Gallbladder Conditions A low-fat diet can be helpful if you have pancreatitis or a gallbladder condition. With these conditions, your pancreas and gallbladder have trouble digesting fats. A healthy eating plan with less fat will help rest your pancreas and gallbladder and reduce your symptoms. WHAT DO I NEED TO KNOW ABOUT THIS DIET? Eat a low-fat diet. Reduce your fat intake to less than 20-30% of your total daily calories. This is less than 50-60 g of fat per day. Remember that you need some fat in your diet. Ask your dietician what your daily goal should  be. Choose nonfat and low-fat healthy foods. Look for the words "nonfat," "low fat," or "fat free." As a guide, look on the label and choose foods with less than 3 g of fat per serving. Eat only one serving. Avoid alcohol. Do not smoke. If you need help quitting, talk with your health care provider. Eat small frequent meals instead of three large heavy meals. WHAT FOODS CAN I EAT? Grains Include healthy grains and starches such as potatoes, wheat bread, fiber-rich cereal, and brown rice. Choose whole grain options whenever possible. In adults, whole grains should account for 45-65% of your daily calories.  Fruits and Vegetables Eat plenty of fruits and vegetables. Fresh fruits and vegetables add fiber to your diet. Meats and Other Protein Sources Eat lean meat such as chicken and pork. Trim any fat off of meat before cooking it. Eggs, fish, and beans are other sources of protein. In adults, these foods should account for 10-35% of your daily calories. Dairy Choose low-fat milk and dairy options. Dairy includes fat and protein, as well as calcium.  Fats and Oils Limit high-fat foods such as fried foods, sweets, baked goods, sugary drinks.  Other Creamy sauces and condiments, such as mayonnaise, can add extra fat. Think about whether or not you need to use them, or use smaller amounts or low fat options.   WHAT FOODS ARE NOT RECOMMENDED? High fat foods, such as: Baked goods. Ice cream. French toast. Sweet rolls. Pizza. Cheese bread. Foods covered with batter, butter, creamy sauces, or cheese. Fried foods. Sugary drinks and desserts. Foods that cause gas or bloating   This information is not intended to replace advice given to you by your health care provider. Make sure you discuss any questions you have with your health care provider.   Document Released: 09/21/2013 Document Reviewed: 09/21/2013 Elsevier Interactive Patient Education 2016 Elsevier Inc.   

## 2022-07-24 NOTE — Telephone Encounter (Signed)
Patient has been advised of Pre-Admission date/time, and Surgery date at [facility].  Surgery Date: 08/15/22 Preadmission Testing Date: 08/08/22 (phone 1p-5p)  Patient has been made aware to call [phone], between 1-3:00pm the day before surgery, to find out what time to arrive for surgery.

## 2022-07-25 ENCOUNTER — Telehealth: Payer: Self-pay | Admitting: *Deleted

## 2022-07-25 NOTE — H&P (View-Only) (Signed)
Patient ID: Sandra Hess, female   DOB: 02/04/1956, 65 y.o.   MRN: [id number]  HPI Sandra Hess is a 65 y.o. female seen in consultation at the request of Dr. Smith for biliary colic.  She presented a few days ago to the hospital complaining of epigastric pain that was moderate to severe intensity and radiated to the right upper quadrant.  The pain is sharp and actually improved with time.  She reports that she did have a heavy meal the day. SHe reports also some nausea and vomiting that day..  He has not had any recurrent attacks since the last ER visit S He denies any fevers any chills no evidence of jaundice.  No evidence of biliary obstruction.  She did have ultrasound that I personally reviewed showing evidence of gallstones no evidence of biliary dilation.  No evidence of cholecystitis.  Normal common bile duct.  Did have some mildly transient increase in AST and ALT.  Also had a white count of 13.6 and the rest of the labs were normal. He is able to perform more than 4 METS of activity without any shortness of breath or chest pain.   Prior abdominal operation includes a hysterectomy Prior episode 2 years ago   HPI  Past Medical History:  Diagnosis Date   Allergy    Blood transfusion without reported diagnosis    Cigarette smoker    Hypothyroidism    Kidney calculus     Past Surgical History:  Procedure Laterality Date   ABDOMINAL HYSTERECTOMY      Family History  Adopted: Yes  Problem Relation Age of Onset   Heart disease Mother    Stroke Mother    Stroke Father        deceased at 54 y/o   Asthma Sister        deceased at 21 y/o   Healthy Son    Healthy Son    Healthy Son     Social History Social History   Tobacco Use   Smoking status: Every Day    Packs/day: 0.25    Years: 40.00    Total pack years: 10.00    Types: Cigarettes, E-cigarettes   Smokeless tobacco: Never  Vaping Use   Vaping Use: Some days  Substance Use Topics   Alcohol use: Yes     Alcohol/week: 1.0 standard drink of alcohol    Types: 1 Glasses of wine per week   Drug use: Never    No Known Allergies  Current Outpatient Medications  Medication Sig Dispense Refill   Calcium Carb-Cholecalciferol (CALTRATE 600+D3) 600-800 MG-UNIT TABS Take 1 tablet by mouth daily.     estradiol (ESTRACE) 1 MG tablet Take 1 mg by mouth daily.     levothyroxine (SYNTHROID) 88 MCG tablet Take 88 mcg by mouth daily before breakfast.     Multiple Vitamin (MULTI-VITAMIN) tablet Take 1 tablet by mouth daily.     Multiple Vitamins-Minerals (EQL ONE DAILY WOMENS 50+ ADV) TABS Take 1 tablet by mouth daily.     ondansetron (ZOFRAN-ODT) 4 MG disintegrating tablet Take 1 tablet (4 mg total) by mouth every 8 (eight) hours as needed. 20 tablet 0   No current facility-administered medications for this visit.     Review of Systems Full ROS  was asked and was negative except for the information on the HPI  Physical Exam Blood pressure (!) 145/82, pulse 67, temperature 98.3 F (36.8 C), temperature source Oral, height 5' 1" (1.549 m), weight 127   lb (57.6 kg), SpO2 98 %. CONSTITUTIONAL: NAD. EYES: Pupils are equal, round,  Sclera are non-icteric. EARS, NOSE, MOUTH AND THROAT: The oral mucosa is pink and moist. Hearing is intact to voice. LYMPH NODES:  Lymph nodes in the neck are normal. RESPIRATORY:  Lungs are clear. There is normal respiratory effort, with equal breath sounds bilaterally, and without pathologic use of accessory muscles. CARDIOVASCULAR: Heart is regular without murmurs, gallops, or rubs. GI: The abdomen is  soft, nontender, and nondistended. There are no palpable masses. There is no hepatosplenomegaly. There are normal bowel sounds in all quadrants. GU: Rectal deferred.   MUSCULOSKELETAL: Normal muscle strength and tone. No cyanosis or edema.   SKIN: Turgor is good and there are no pathologic skin lesions or ulcers. NEUROLOGIC: Motor and sensation is grossly normal. Cranial nerves  are grossly intact. PSYCH:  Oriented to person, place and time. Affect is normal.  Data Reviewed  I have personally reviewed the patient's imaging, laboratory findings and medical records.    Assessment/Plan 66 year old female with classic signs and symptoms consistent with biliary colic.  This is the second episode that she has had.  Definitely recommend prompt cholecystectomy.  Procedure discussed with the patient in detail.  I do think that she is a good candidate for robotic approach The risks, benefits, complications, treatment options, and expected outcomes were discussed with the patient. The possibilities of bleeding, recurrent infection, finding a normal gallbladder, perforation of viscus organs, damage to surrounding structures, bile leak, abscess formation, needing a drain placed, the need for additional procedures, reaction to medication, pulmonary aspiration,  failure to diagnose a condition, the possible need to convert to an open procedure, and creating a complication requiring transfusion or operation were discussed with the patient. The patient and/or family concurred with the proposed plan, giving informed consent.  We will schedule procedure at her convenience in the next couple weeks or so. May repeat LFT in case we need to no IOC A copy of this report was sent to the referring provider Please note that I have spent 55 minutes in this encounter including personally reviewing imaging studies, coordinating care, placing orders, performing appropriate documentation.      Caroleen Hamman, MD FACS General Surgeon 07/25/2022, 2:38 PM

## 2022-07-25 NOTE — Progress Notes (Signed)
Patient ID: Sandra Hess, female   DOB: 1955-11-14, 66 y.o.   MRN: [id number]  HPI Sandra Hess is a 66 y.o. female seen in consultation at the request of Dr. Tamala Julian for biliary colic.  She presented a few days ago to the hospital complaining of epigastric pain that was moderate to severe intensity and radiated to the right upper quadrant.  The pain is sharp and actually improved with time.  She reports that she did have a heavy meal the day. SHe reports also some nausea and vomiting that day.Marland Kitchen  He has not had any recurrent attacks since the last ER visit S He denies any fevers any chills no evidence of jaundice.  No evidence of biliary obstruction.  She did have ultrasound that I personally reviewed showing evidence of gallstones no evidence of biliary dilation.  No evidence of cholecystitis.  Normal common bile duct.  Did have some mildly transient increase in AST and ALT.  Also had a white count of 13.6 and the rest of the labs were normal. He is able to perform more than 4 METS of activity without any shortness of breath or chest pain.   Prior abdominal operation includes a hysterectomy Prior episode 2 years ago   HPI  Past Medical History:  Diagnosis Date   Allergy    Blood transfusion without reported diagnosis    Cigarette smoker    Hypothyroidism    Kidney calculus     Past Surgical History:  Procedure Laterality Date   ABDOMINAL HYSTERECTOMY      Family History  Adopted: Yes  Problem Relation Age of Onset   Heart disease Mother    Stroke Mother    Stroke Father        deceased at 69 y/o   Asthma Sister        deceased at 78 y/o   Healthy Son    Healthy Son    Healthy Son     Social History Social History   Tobacco Use   Smoking status: Every Day    Packs/day: 0.25    Years: 40.00    Total pack years: 10.00    Types: Cigarettes, E-cigarettes   Smokeless tobacco: Never  Vaping Use   Vaping Use: Some days  Substance Use Topics   Alcohol use: Yes     Alcohol/week: 1.0 standard drink of alcohol    Types: 1 Glasses of wine per week   Drug use: Never    No Known Allergies  Current Outpatient Medications  Medication Sig Dispense Refill   Calcium Carb-Cholecalciferol (CALTRATE 600+D3) 600-800 MG-UNIT TABS Take 1 tablet by mouth daily.     estradiol (ESTRACE) 1 MG tablet Take 1 mg by mouth daily.     levothyroxine (SYNTHROID) 88 MCG tablet Take 88 mcg by mouth daily before breakfast.     Multiple Vitamin (MULTI-VITAMIN) tablet Take 1 tablet by mouth daily.     Multiple Vitamins-Minerals (EQL ONE DAILY WOMENS 50+ ADV) TABS Take 1 tablet by mouth daily.     ondansetron (ZOFRAN-ODT) 4 MG disintegrating tablet Take 1 tablet (4 mg total) by mouth every 8 (eight) hours as needed. 20 tablet 0   No current facility-administered medications for this visit.     Review of Systems Full ROS  was asked and was negative except for the information on the HPI  Physical Exam Blood pressure (!) 145/82, pulse 67, temperature 98.3 F (36.8 C), temperature source Oral, height 5\' 1"  (1.549 m), weight 127  lb (57.6 kg), SpO2 98 %. CONSTITUTIONAL: NAD. EYES: Pupils are equal, round,  Sclera are non-icteric. EARS, NOSE, MOUTH AND THROAT: The oral mucosa is pink and moist. Hearing is intact to voice. LYMPH NODES:  Lymph nodes in the neck are normal. RESPIRATORY:  Lungs are clear. There is normal respiratory effort, with equal breath sounds bilaterally, and without pathologic use of accessory muscles. CARDIOVASCULAR: Heart is regular without murmurs, gallops, or rubs. GI: The abdomen is  soft, nontender, and nondistended. There are no palpable masses. There is no hepatosplenomegaly. There are normal bowel sounds in all quadrants. GU: Rectal deferred.   MUSCULOSKELETAL: Normal muscle strength and tone. No cyanosis or edema.   SKIN: Turgor is good and there are no pathologic skin lesions or ulcers. NEUROLOGIC: Motor and sensation is grossly normal. Cranial nerves  are grossly intact. PSYCH:  Oriented to person, place and time. Affect is normal.  Data Reviewed  I have personally reviewed the patient's imaging, laboratory findings and medical records.    Assessment/Plan 66 year old female with classic signs and symptoms consistent with biliary colic.  This is the second episode that she has had.  Definitely recommend prompt cholecystectomy.  Procedure discussed with the patient in detail.  I do think that she is a good candidate for robotic approach The risks, benefits, complications, treatment options, and expected outcomes were discussed with the patient. The possibilities of bleeding, recurrent infection, finding a normal gallbladder, perforation of viscus organs, damage to surrounding structures, bile leak, abscess formation, needing a drain placed, the need for additional procedures, reaction to medication, pulmonary aspiration,  failure to diagnose a condition, the possible need to convert to an open procedure, and creating a complication requiring transfusion or operation were discussed with the patient. The patient and/or family concurred with the proposed plan, giving informed consent.  We will schedule procedure at her convenience in the next couple weeks or so. May repeat LFT in case we need to no IOC A copy of this report was sent to the referring provider Please note that I have spent 55 minutes in this encounter including personally reviewing imaging studies, coordinating care, placing orders, performing appropriate documentation.      Caroleen Hamman, MD FACS General Surgeon 07/25/2022, 2:38 PM

## 2022-07-25 NOTE — Telephone Encounter (Signed)
Patient called and needs a work note stating that she is going to be out of work on 08/15/22 for surgery ( lap choley Dr Dahlia Byes) and that she can return to work at home on the computer on 08/19/22-09/01/22 and return to work in the office on 09/02/22, with restrictions of 15lbs until 09/26/22. Patient would like this to be uploaded on her mychart

## 2022-08-08 ENCOUNTER — Other Ambulatory Visit: Payer: Self-pay

## 2022-08-08 ENCOUNTER — Encounter
Admission: RE | Admit: 2022-08-08 | Discharge: 2022-08-08 | Disposition: A | Discharge status: Home / Self Care | Payer: BLUE CROSS/BLUE SHIELD | Source: Ambulatory Visit | Attending: Surgery | Admitting: Surgery

## 2022-08-08 HISTORY — DX: Anemia, unspecified: D64.9

## 2022-08-08 HISTORY — DX: Personal history of urinary calculi: Z87.442

## 2022-08-08 NOTE — Patient Instructions (Addendum)
Your procedure is scheduled on: 08/15/22 - Thursday Report to the Registration Desk on the 1st floor of the [facility]. To find out your arrival time, please call [phone] between 1PM - 3PM on: 08/14/22 - Wednesday If your arrival time is 6:00 am, do not arrive prior to that time as the [facility] entrance doors do not open until 6:00 am.  REMEMBER: Instructions that are not followed completely may result in serious medical risk, up to and including death; or upon the discretion of your surgeon and anesthesiologist your surgery may need to be rescheduled.  Do not eat food after midnight the night before surgery.  No gum chewing, lozengers or hard candies.  You may however, drink CLEAR liquids up to 2 hours before you are scheduled to arrive for your surgery. Do not drink anything within 2 hours of your scheduled arrival time.  Clear liquids include: - water  - apple juice without pulp - gatorade (not RED colors) - black coffee or tea (Do NOT add milk or creamers to the coffee or tea) Do NOT drink anything that is not on this list.  TAKE THESE MEDICATIONS THE MORNING OF SURGERY WITH A SIP OF WATER:  - levothyroxine (SYNTHROID) - estradiol (ESTRACE)    One week prior to surgery: Stop Anti-inflammatories (NSAIDS) such as Advil, Aleve, Ibuprofen, Motrin, Naproxen, Naprosyn and Aspirin based products such as Excedrin, Goodys Powder, BC Powder.  Stop ANY OVER THE COUNTER supplements until after surgery.  You may however, continue to take Tylenol if needed for pain up until the day of surgery.  No Alcohol for 24 hours before or after surgery.  No Smoking including e-cigarettes for 24 hours prior to surgery.  No chewable tobacco products for at least 6 hours prior to surgery.  No nicotine patches on the day of surgery.  Do not use any "recreational" drugs for at least a week prior to your surgery.  Please be advised that the combination of cocaine and anesthesia may have  negative outcomes, up to and including death. If you test positive for cocaine, your surgery will be cancelled.  On the morning of surgery brush your teeth with toothpaste and water, you may rinse your mouth with mouthwash if you wish. Do not swallow any toothpaste or mouthwash.  Use CHG Soap or wipes as directed on instruction sheet.  Do not wear jewelry, make-up, hairpins, clips or nail polish.  Do not wear lotions, powders, or perfumes.   Do not shave body from the neck down 48 hours prior to surgery just in case you cut yourself which could leave a site for infection.  Also, freshly shaved skin may become irritated if using the CHG soap.  Contact lenses, hearing aids and dentures may not be worn into surgery.  Do not bring valuables to the hospital. [facility] is not responsible for any missing/lost belongings or valuables.   Notify your doctor if there is any change in your medical condition (cold, fever, infection).  Wear comfortable clothing (specific to your surgery type) to the hospital.  After surgery, you can help prevent lung complications by doing breathing exercises.  Take deep breaths and cough every 1-2 hours. Your doctor may order a device called an Incentive Spirometer to help you take deep breaths. When coughing or sneezing, hold a pillow firmly against your incision with both hands. This is called "splinting." Doing this helps protect your incision. It also decreases belly discomfort.  If you are being admitted to the  hospital overnight, leave your suitcase in the car. After surgery it may be brought to your room.  If you are being discharged the day of surgery, you will not be allowed to drive home. You will need a responsible adult (18 years or older) to drive you home and stay with you that night.   If you are taking public transportation, you will need to have a responsible adult (18 years or older) with you. Please confirm with your physician that it is  acceptable to use public transportation.   Please call the Pre-admissions Testing Dept. at [phone] if you have any questions about these instructions.  Surgery Visitation Policy:  Patients undergoing a surgery or procedure may have two family members or support persons with them as long as the person is not COVID-19 positive or experiencing its symptoms.   Inpatient Visitation:    Visiting hours are 7 a.m. to 8 p.m. Up to four visitors are allowed at one time in a patient room, including children. The visitors may rotate out with other people during the day. One designated support person (adult) may remain overnight.

## 2022-08-12 ENCOUNTER — Telehealth: Payer: Self-pay | Admitting: *Deleted

## 2022-08-12 NOTE — Telephone Encounter (Signed)
Patient would like to come pick up a return to work note, She wants to return to work on 08/26/22.  Surgery scheduled for 08/15/22 Dr Everlene Farrier gallbladder

## 2022-08-15 ENCOUNTER — Encounter: Payer: Self-pay | Admitting: Surgery

## 2022-08-15 ENCOUNTER — Encounter
Admission: RE | Disposition: A | Discharge status: Home / Self Care | Payer: Self-pay | Source: Home / Self Care | Attending: Surgery

## 2022-08-15 ENCOUNTER — Other Ambulatory Visit: Payer: Self-pay

## 2022-08-15 ENCOUNTER — Ambulatory Visit
Admission: RE | Admit: 2022-08-15 | Discharge: 2022-08-15 | Disposition: A | Discharge status: Home / Self Care | Payer: BLUE CROSS/BLUE SHIELD | Attending: Surgery | Admitting: Surgery

## 2022-08-15 ENCOUNTER — Ambulatory Visit: Payer: BLUE CROSS/BLUE SHIELD | Admitting: Anesthesiology

## 2022-08-15 DIAGNOSIS — E039 Hypothyroidism, unspecified: Secondary | ICD-10-CM | POA: Insufficient documentation

## 2022-08-15 DIAGNOSIS — K8010 Calculus of gallbladder with chronic cholecystitis without obstruction: Secondary | ICD-10-CM | POA: Diagnosis not present

## 2022-08-15 DIAGNOSIS — F17290 Nicotine dependence, other tobacco product, uncomplicated: Secondary | ICD-10-CM | POA: Insufficient documentation

## 2022-08-15 DIAGNOSIS — K8050 Calculus of bile duct without cholangitis or cholecystitis without obstruction: Secondary | ICD-10-CM | POA: Diagnosis not present

## 2022-08-15 DIAGNOSIS — F17210 Nicotine dependence, cigarettes, uncomplicated: Secondary | ICD-10-CM | POA: Insufficient documentation

## 2022-08-15 DIAGNOSIS — Z90710 Acquired absence of both cervix and uterus: Secondary | ICD-10-CM | POA: Diagnosis not present

## 2022-08-15 DIAGNOSIS — K8020 Calculus of gallbladder without cholecystitis without obstruction: Secondary | ICD-10-CM

## 2022-08-15 LAB — CBC WITH DIFFERENTIAL/PLATELET
Abs Immature Granulocytes: 0.01 10*3/uL (ref 0.00–0.07)
Basophils Absolute: 0 10*3/uL (ref 0.0–0.1)
Basophils Relative: 1 %
Eosinophils Absolute: 0 10*3/uL (ref 0.0–0.5)
Eosinophils Relative: 1 %
HCT: 38.6 % (ref 36.0–46.0)
Hemoglobin: 13.2 g/dL (ref 12.0–15.0)
Immature Granulocytes: 0 %
Lymphocytes Relative: 38 %
Lymphs Abs: 1.7 10*3/uL (ref 0.7–4.0)
MCH: 31.6 pg (ref 26.0–34.0)
MCHC: 34.2 g/dL (ref 30.0–36.0)
MCV: 92.3 fL (ref 80.0–100.0)
Monocytes Absolute: 0.3 10*3/uL (ref 0.1–1.0)
Monocytes Relative: 7 %
Neutro Abs: 2.3 10*3/uL (ref 1.7–7.7)
Neutrophils Relative %: 53 %
Platelets: 274 10*3/uL (ref 150–400)
RBC: 4.18 MIL/uL (ref 3.87–5.11)
RDW: 13 % (ref 11.5–15.5)
WBC: 4.4 10*3/uL (ref 4.0–10.5)
nRBC: 0 % (ref 0.0–0.2)

## 2022-08-15 LAB — COMPREHENSIVE METABOLIC PANEL
ALT: 15 U/L (ref 0–44)
AST: 18 U/L (ref 15–41)
Albumin: 3.8 g/dL (ref 3.5–5.0)
Alkaline Phosphatase: 63 U/L (ref 38–126)
Anion gap: 6 (ref 5–15)
BUN: 9 mg/dL (ref 8–23)
CO2: 25 mmol/L (ref 22–32)
Calcium: 9.2 mg/dL (ref 8.9–10.3)
Chloride: 110 mmol/L (ref 98–111)
Creatinine, Ser: 0.73 mg/dL (ref 0.44–1.00)
GFR, Estimated: >60 mL/min (ref >60)
Glucose, Bld: 87 mg/dL (ref 70–99)
Potassium: 4.1 mmol/L (ref 3.5–5.1)
Sodium: 141 mmol/L (ref 135–145)
Total Bilirubin: 0.8 mg/dL (ref 0.3–1.2)
Total Protein: 6.5 g/dL (ref 6.5–8.1)

## 2022-08-15 SURGERY — CHOLECYSTECTOMY, ROBOT-ASSISTED, LAPAROSCOPIC
Anesthesia: General

## 2022-08-15 MED ORDER — CHLORHEXIDINE GLUCONATE 0.12 % MT SOLN
OROMUCOSAL | Status: AC
  Administered 2022-08-15: 15 mL via OROMUCOSAL
  Filled 2022-08-15: qty 15

## 2022-08-15 MED ORDER — PHENYLEPHRINE HCL-NACL 20-0.9 MG/250ML-% IV SOLN
INTRAVENOUS | Status: AC
  Filled 2022-08-15: qty 250

## 2022-08-15 MED ORDER — FENTANYL CITRATE (PF) 100 MCG/2ML IJ SOLN
INTRAMUSCULAR | Status: AC
  Filled 2022-08-15: qty 2

## 2022-08-15 MED ORDER — CELECOXIB 200 MG PO CAPS
ORAL_CAPSULE | ORAL | Status: AC
  Administered 2022-08-15: 200 mg via ORAL
  Filled 2022-08-15: qty 1

## 2022-08-15 MED ORDER — GABAPENTIN 300 MG PO CAPS: 300.0000 mg | ORAL_CAPSULE | ORAL | Status: AC

## 2022-08-15 MED ORDER — DEXAMETHASONE SODIUM PHOSPHATE 10 MG/ML IJ SOLN
INTRAMUSCULAR | Status: DC | PRN
  Administered 2022-08-15: 5 mg via INTRAVENOUS

## 2022-08-15 MED ORDER — ACETAMINOPHEN 500 MG PO TABS
ORAL_TABLET | ORAL | Status: AC
  Administered 2022-08-15: 1000 mg via ORAL
  Filled 2022-08-15: qty 2

## 2022-08-15 MED ORDER — ROCURONIUM BROMIDE 100 MG/10ML IV SOLN
INTRAVENOUS | Status: DC | PRN
  Administered 2022-08-15: 50 mg via INTRAVENOUS

## 2022-08-15 MED ORDER — OXYCODONE HCL 5 MG/5ML PO SOLN: 5.0000 mg | Freq: Once | ORAL | Status: AC | PRN

## 2022-08-15 MED ORDER — FAMOTIDINE 20 MG PO TABS
ORAL_TABLET | ORAL | Status: AC
  Administered 2022-08-15: 20 mg via ORAL
  Filled 2022-08-15: qty 1

## 2022-08-15 MED ORDER — PROPOFOL 10 MG/ML IV BOLUS
INTRAVENOUS | Status: DC | PRN
  Administered 2022-08-15: 130 mg via INTRAVENOUS

## 2022-08-15 MED ORDER — OXYCODONE HCL 5 MG PO TABS
ORAL_TABLET | ORAL | Status: AC
  Administered 2022-08-15: 5 mg via ORAL
  Filled 2022-08-15: qty 1

## 2022-08-15 MED ORDER — ONDANSETRON HCL 4 MG/2ML IJ SOLN
INTRAMUSCULAR | Status: DC | PRN
  Administered 2022-08-15: 4 mg via INTRAVENOUS

## 2022-08-15 MED ORDER — GLYCOPYRROLATE 0.2 MG/ML IJ SOLN
INTRAMUSCULAR | Status: DC | PRN
  Administered 2022-08-15: .2 mg via INTRAVENOUS

## 2022-08-15 MED ORDER — FENTANYL CITRATE (PF) 100 MCG/2ML IJ SOLN
25.0000 ug | INTRAMUSCULAR | Status: DC | PRN
  Administered 2022-08-15 (×3): 25 ug via INTRAVENOUS

## 2022-08-15 MED ORDER — PHENYLEPHRINE 80 MCG/ML (10ML) SYRINGE FOR IV PUSH (FOR BLOOD PRESSURE SUPPORT)
PREFILLED_SYRINGE | INTRAVENOUS | Status: DC | PRN
  Administered 2022-08-15: 80 ug via INTRAVENOUS

## 2022-08-15 MED ORDER — FENTANYL CITRATE (PF) 100 MCG/2ML IJ SOLN
INTRAMUSCULAR | Status: AC
  Administered 2022-08-15: 25 ug via INTRAVENOUS
  Filled 2022-08-15: qty 2

## 2022-08-15 MED ORDER — SUGAMMADEX SODIUM 200 MG/2ML IV SOLN
INTRAVENOUS | Status: DC | PRN
  Administered 2022-08-15: 200 mg via INTRAVENOUS

## 2022-08-15 MED ORDER — CHLORHEXIDINE GLUCONATE 0.12 % MT SOLN: 15.0000 mL | Freq: Once | OROMUCOSAL | Status: AC

## 2022-08-15 MED ORDER — INDOCYANINE GREEN 25 MG IV SOLR
2.5000 mg | Freq: Once | INTRAVENOUS | Status: AC
  Administered 2022-08-15: 2.5 mg via INTRAVENOUS
  Filled 2022-08-15: qty 1

## 2022-08-15 MED ORDER — BUPIVACAINE-EPINEPHRINE (PF) 0.25% -1:200000 IJ SOLN
INTRAMUSCULAR | Status: AC
  Filled 2022-08-15: qty 30

## 2022-08-15 MED ORDER — CEFAZOLIN SODIUM-DEXTROSE 2-4 GM/100ML-% IV SOLN
INTRAVENOUS | Status: AC
  Filled 2022-08-15: qty 100

## 2022-08-15 MED ORDER — CHLORHEXIDINE GLUCONATE CLOTH 2 % EX PADS
6.0000 | MEDICATED_PAD | Freq: Once | CUTANEOUS | Status: AC
  Administered 2022-08-15: 6 via TOPICAL

## 2022-08-15 MED ORDER — CEFAZOLIN SODIUM-DEXTROSE 2-4 GM/100ML-% IV SOLN
2.0000 g | INTRAVENOUS | Status: AC
  Administered 2022-08-15: 2 g via INTRAVENOUS

## 2022-08-15 MED ORDER — MIDAZOLAM HCL 2 MG/2ML IJ SOLN
INTRAMUSCULAR | Status: DC | PRN
  Administered 2022-08-15 (×2): 2 mg via INTRAVENOUS

## 2022-08-15 MED ORDER — BUPIVACAINE LIPOSOME 1.3 % IJ SUSP
INTRAMUSCULAR | Status: AC
  Filled 2022-08-15: qty 20

## 2022-08-15 MED ORDER — LIDOCAINE HCL (CARDIAC) PF 100 MG/5ML IV SOSY
PREFILLED_SYRINGE | INTRAVENOUS | Status: DC | PRN
  Administered 2022-08-15: 60 mg via INTRAVENOUS

## 2022-08-15 MED ORDER — BUPIVACAINE-EPINEPHRINE 0.25% -1:200000 IJ SOLN
INTRAMUSCULAR | Status: DC | PRN
  Administered 2022-08-15: 50 mL

## 2022-08-15 MED ORDER — MIDAZOLAM HCL 2 MG/2ML IJ SOLN
INTRAMUSCULAR | Status: AC
  Filled 2022-08-15: qty 2

## 2022-08-15 MED ORDER — CEFAZOLIN SODIUM 1 G IJ SOLR
INTRAMUSCULAR | Status: AC
  Filled 2022-08-15: qty 10

## 2022-08-15 MED ORDER — HYDROCODONE-ACETAMINOPHEN 5-325 MG PO TABS: 1.0000 | ORAL_TABLET | ORAL | 0 refills | Status: DC | PRN

## 2022-08-15 MED ORDER — OXYCODONE HCL 5 MG PO TABS: 5.0000 mg | ORAL_TABLET | Freq: Once | ORAL | Status: AC | PRN

## 2022-08-15 MED ORDER — ACETAMINOPHEN 500 MG PO TABS: 1000.0000 mg | ORAL_TABLET | ORAL | Status: AC

## 2022-08-15 MED ORDER — CELECOXIB 200 MG PO CAPS: 200.0000 mg | ORAL_CAPSULE | ORAL | Status: AC

## 2022-08-15 MED ORDER — GABAPENTIN 300 MG PO CAPS
ORAL_CAPSULE | ORAL | Status: AC
  Administered 2022-08-15: 300 mg via ORAL
  Filled 2022-08-15: qty 1

## 2022-08-15 MED ORDER — SEVOFLURANE IN SOLN
RESPIRATORY_TRACT | Status: AC
  Filled 2022-08-15: qty 250

## 2022-08-15 MED ORDER — FENTANYL CITRATE (PF) 100 MCG/2ML IJ SOLN
INTRAMUSCULAR | Status: DC | PRN
  Administered 2022-08-15: 100 ug via INTRAVENOUS

## 2022-08-15 MED ORDER — 0.9 % SODIUM CHLORIDE (POUR BTL) OPTIME
TOPICAL | Status: DC | PRN
  Administered 2022-08-15: 200 mL

## 2022-08-15 MED ORDER — ORAL CARE MOUTH RINSE: 15.0000 mL | Freq: Once | OROMUCOSAL | Status: AC

## 2022-08-15 MED ORDER — LACTATED RINGERS IV SOLN: INTRAVENOUS | Status: DC

## 2022-08-15 MED ORDER — SODIUM CHLORIDE 0.9 % IR SOLN
Status: DC | PRN
  Administered 2022-08-15: 100 mL

## 2022-08-15 MED ORDER — FAMOTIDINE 20 MG PO TABS: 20.0000 mg | ORAL_TABLET | Freq: Once | ORAL | Status: AC

## 2022-08-15 MED ORDER — KETOROLAC TROMETHAMINE 30 MG/ML IJ SOLN
INTRAMUSCULAR | Status: DC | PRN
  Administered 2022-08-15: 30 mg via INTRAVENOUS

## 2022-08-15 SURGICAL SUPPLY — 44 items
CANNULA REDUC XI 12-8 STAPL (CANNULA) ×1
CANNULA REDUCER 12-8 DVNC XI (CANNULA) ×1 IMPLANT
CLIP LIGATING HEMO O LOK GREEN (MISCELLANEOUS) ×1 IMPLANT
DERMABOND ADVANCED .7 DNX12 (GAUZE/BANDAGES/DRESSINGS) ×1 IMPLANT
DRAPE ARM DVNC X/XI (DISPOSABLE) ×4 IMPLANT
DRAPE COLUMN DVNC XI (DISPOSABLE) ×1 IMPLANT
DRAPE DA VINCI XI ARM (DISPOSABLE) ×4
DRAPE DA VINCI XI COLUMN (DISPOSABLE) ×1
ELECT CAUTERY BLADE 6.4 (BLADE) ×1 IMPLANT
ELECT REM PT RETURN 9FT ADLT (ELECTROSURGICAL) ×1
ELECTRODE REM PT RTRN 9FT ADLT (ELECTROSURGICAL) ×1 IMPLANT
GLOVE BIO SURGEON STRL SZ7 (GLOVE) ×2 IMPLANT
GOWN STRL REUS W/ TWL LRG LVL3 (GOWN DISPOSABLE) ×4 IMPLANT
GOWN STRL REUS W/TWL LRG LVL3 (GOWN DISPOSABLE) ×3
IRRIGATION STRYKERFLOW (MISCELLANEOUS) IMPLANT
IRRIGATOR STRYKERFLOW (MISCELLANEOUS) ×1
IV NS 1000ML (IV SOLUTION) ×1
IV NS 1000ML BAXH (IV SOLUTION) IMPLANT
KIT PINK PAD W/HEAD ARE REST (MISCELLANEOUS) ×1
KIT PINK PAD W/HEAD ARM REST (MISCELLANEOUS) ×1 IMPLANT
LABEL OR SOLS (LABEL) ×1 IMPLANT
MANIFOLD NEPTUNE II (INSTRUMENTS) ×1 IMPLANT
NEEDLE HYPO 22GX1.5 SAFETY (NEEDLE) ×1 IMPLANT
NS IRRIG 500ML POUR BTL (IV SOLUTION) ×1 IMPLANT
OBTURATOR OPTICAL STANDARD 8MM (TROCAR) ×1
OBTURATOR OPTICAL STND 8 DVNC (TROCAR) ×1
OBTURATOR OPTICALSTD 8 DVNC (TROCAR) ×1 IMPLANT
PACK LAP CHOLECYSTECTOMY (MISCELLANEOUS) ×1 IMPLANT
PENCIL SMOKE EVACUATOR (MISCELLANEOUS) ×1 IMPLANT
SEAL CANN UNIV 5-8 DVNC XI (MISCELLANEOUS) ×3 IMPLANT
SEAL XI 5MM-8MM UNIVERSAL (MISCELLANEOUS) ×3
SET TUBE SMOKE EVAC HIGH FLOW (TUBING) ×1 IMPLANT
SOLUTION ELECTROLUBE (MISCELLANEOUS) ×1 IMPLANT
SPIKE FLUID TRANSFER (MISCELLANEOUS) ×1 IMPLANT
SPONGE T-LAP 18X18 ~~LOC~~+RFID (SPONGE) ×1 IMPLANT
SPONGE T-LAP 4X18 ~~LOC~~+RFID (SPONGE) IMPLANT
STAPLER CANNULA SEAL DVNC XI (STAPLE) ×1 IMPLANT
STAPLER CANNULA SEAL XI (STAPLE) ×1
SUT MNCRL AB 4-0 PS2 18 (SUTURE) ×1 IMPLANT
SUT VICRYL 0 UR6 27IN ABS (SUTURE) ×2 IMPLANT
SYR 20ML LL LF (SYRINGE) ×1 IMPLANT
SYS BAG RETRIEVAL 10MM (BASKET) ×1
SYSTEM BAG RETRIEVAL 10MM (BASKET) ×1 IMPLANT
WATER STERILE IRR 500ML POUR (IV SOLUTION) ×1 IMPLANT

## 2022-08-15 NOTE — Interval H&P Note (Signed)
History and Physical Interval Note:  08/15/2022 7:22 AM  Sandra Hess  has presented today for surgery, with the diagnosis of Biliary colic.  The various methods of treatment have been discussed with the patient and family. After consideration of risks, benefits and other options for treatment, the patient has consented to  Procedure(s): XI ROBOTIC ASSISTED LAPAROSCOPIC CHOLECYSTECTOMY (N/A) INDOCYANINE GREEN FLUORESCENCE IMAGING (ICG) (N/A) as a surgical intervention.  The patient's history has been reviewed, patient examined, no change in status, stable for surgery.  I have reviewed the patient's chart and labs.  Questions were answered to the patient's satisfaction.     Ragan Duhon F Keegan Ducey

## 2022-08-15 NOTE — Op Note (Signed)
Robotic assisted laparoscopic Cholecystectomy  Pre-operative Diagnosis: Biliary colic  Post-operative Diagnosis: same  Procedure:  Robotic assisted laparoscopic Cholecystectomy  Surgeon: Sterling Big, MD FACS  Anesthesia: Gen. with endotracheal tube  Findings: Chronic mild Cholecystitis   Estimated Blood Loss:5 cc       Specimens: Gallbladder           Complications: none   Procedure Details  The patient was seen again in the Holding Room. The benefits, complications, treatment options, and expected outcomes were discussed with the patient. The risks of bleeding, infection, recurrence of symptoms, failure to resolve symptoms, bile duct damage, bile duct leak, retained common bile duct stone, bowel injury, any of which could require further surgery and/or ERCP, stent, or papillotomy were reviewed with the patient. The likelihood of improving the patient's symptoms with return to their baseline status is good.  The patient and/or family concurred with the proposed plan, giving informed consent.  The patient was taken to Operating Room, identified  and the procedure verified as Laparoscopic Cholecystectomy.  A Time Out was held and the above information confirmed.  Prior to the induction of general anesthesia, antibiotic prophylaxis was administered. VTE prophylaxis was in place. General endotracheal anesthesia was then administered and tolerated well. After the induction, the abdomen was prepped with Chloraprep and draped in the sterile fashion. The patient was positioned in the supine position.  Cut down technique was used to enter the abdominal cavity and a Hasson trochar was placed after two vicryl stitches were anchored to the fascia. Pneumoperitoneum was then created with CO2 and tolerated well without any adverse changes in the patient's vital signs.  No evidence of bowel injuries observed. Three 8-mm ports were placed under direct vision. All skin incisions  were infiltrated with a  local anesthetic agent before making the incision and placing the trocars.   The patient was positioned  in reverse Trendelenburg, robot was brought to the surgical field and docked in the standard fashion.  We made sure all the instrumentation was kept indirect view at all times and that there were no collision between the arms. I scrubbed out and went to the console.  The gallbladder was identified, the fundus grasped and retracted cephalad. Adhesions were lysed bluntly. The infundibulum was grasped and retracted laterally, exposing the peritoneum overlying the triangle of Calot. This was then divided and exposed in a blunt fashion. An extended critical view of the cystic duct and cystic artery was obtained.  The cystic duct was clearly identified and bluntly dissected.   Artery and duct were double clipped and divided. Using ICG cholangiography we visualize the cystic duct and CBD no evidence of bile injuries. The gallbladder was taken from the gallbladder fossa in a retrograde fashion with the electrocautery.  Hemostasis was achieved with the electrocautery. nspection of the right upper quadrant was performed. No bleeding, bile duct injury or leak, or bowel injury was noted. Robotic instruments and robotic arms were undocked in the standard fashion.  I scrubbed back in.  The gallbladder was removed and placed in an Endocatch bag.   Pneumoperitoneum was released.  The periumbilical port site was closed with interrumpted 0 Vicryl sutures. 4-0 subcuticular Monocryl was used to close the skin. Dermabond was  applied.  The patient was then extubated and brought to the recovery room in stable condition. Sponge, lap, and needle counts were correct at closure and at the conclusion of the case.  Caroleen Hamman, MD, FACS

## 2022-08-15 NOTE — Anesthesia Preprocedure Evaluation (Signed)
Anesthesia Evaluation  Patient identified by MRN, date of birth, ID band Patient awake    Reviewed: Allergy & Precautions, NPO status , Patient's Chart, lab work & pertinent test results  History of Anesthesia Complications Negative for: history of anesthetic complications  Airway Mallampati: III  TM Distance: >3 FB Neck ROM: full    Dental  (+) Chipped, Poor Dentition, Missing   Pulmonary neg shortness of breath, Current Smoker and Patient abstained from smoking.   Pulmonary exam normal        Cardiovascular Exercise Tolerance: Good (-) angina (-) Past MI and (-) DOE negative cardio ROS Normal cardiovascular exam     Neuro/Psych negative neurological ROS  negative psych ROS   GI/Hepatic Neg liver ROS,GERD  Controlled,,  Endo/Other  Hypothyroidism    Renal/GU Renal disease     Musculoskeletal   Abdominal   Peds  Hematology negative hematology ROS (+)   Anesthesia Other Findings Past Medical History: No date: Allergy No date: Anemia No date: Blood transfusion without reported diagnosis No date: Cigarette smoker No date: History of kidney stones No date: Hypothyroidism No date: Kidney calculus  Past Surgical History: No date: ABDOMINAL HYSTERECTOMY     Reproductive/Obstetrics negative OB ROS                             Anesthesia Physical Anesthesia Plan  ASA: 3  Anesthesia Plan: General ETT   Post-op Pain Management:    Induction: Intravenous  PONV Risk Score and Plan: Ondansetron, Dexamethasone, Midazolam and Treatment may vary due to age or medical condition  Airway Management Planned: Oral ETT  Additional Equipment:   Intra-op Plan:   Post-operative Plan: Extubation in OR  Informed Consent: I have reviewed the patients History and Physical, chart, labs and discussed the procedure including the risks, benefits and alternatives for the proposed anesthesia with  the patient or authorized representative who has indicated his/her understanding and acceptance.     Dental Advisory Given  Plan Discussed with: Anesthesiologist, CRNA and Surgeon  Anesthesia Plan Comments: (Patient consented for risks of anesthesia including but not limited to:  - adverse reactions to medications - damage to eyes, teeth, lips or other oral mucosa - nerve damage due to positioning  - sore throat or hoarseness - Damage to heart, brain, nerves, lungs, other parts of body or loss of life  Patient voiced understanding.)       Anesthesia Quick Evaluation

## 2022-08-15 NOTE — Discharge Instructions (Addendum)
Laparoscopic Cholecystectomy, Care After   These instructions give you information on caring for yourself after your procedure. Your doctor may also give you more specific instructions. Call your doctor if you have any problems or questions after your procedure.  HOME CARE  Change your bandages (dressings) as told by your doctor.  Keep the wound dry and clean. Wash the wound gently with soap and water. Pat the wound dry with a clean towel.  Do not take baths, swim, or use hot tubs for 2 weeks, or as told by your doctor.  Only take medicine as told by your doctor.  Eat a normal diet as told by your doctor.  Do not lift anything heavier than 10 pounds (4.5 kg) until your doctor says it is okay.  Do not play contact sports for 1 week, or as told by your doctor. GET HELP IF:  Your wound is red, puffy (swollen), or painful.  You have yellowish-white fluid (pus) coming from the wound.  You have fluid draining from the wound for more than 1 day.  You have a bad smell coming from the wound.  Your wound breaks open. GET HELP RIGHT AWAY IF:  You have trouble breathing.  You have chest pain.  You have a fever >101  You have pain in the shoulders (shoulder strap areas) that is getting worse.  You feel dizzy or pass out (faint).  You have severe belly (abdominal) pain.  You feel sick to your stomach (nauseous) or throw up (vomit) for more than 1 day.  AMBULATORY SURGERY  DISCHARGE INSTRUCTIONS   The drugs that you were given will stay in your system until tomorrow so for the next 24 hours you should not:  Drive an automobile Make any legal decisions Drink any alcoholic beverage   You may resume regular meals tomorrow.  Today it is better to start with liquids and gradually work up to solid foods.  You may eat anything you prefer, but it is better to start with liquids, then soup and crackers, and gradually work up to solid foods.   Please notify your doctor immediately if you have any  unusual bleeding, trouble breathing, redness and pain at the surgery site, drainage, fever, or pain not relieved by medication.     Your post-operative visit with Dr.                                       is: Date:                        Time:    Please call to schedule your post-operative visit.  Additional Instructions:               

## 2022-08-15 NOTE — Anesthesia Procedure Notes (Signed)
Procedure Name: Intubation Date/Time: 08/15/2022 7:41 AM  Performed by: Lorie Apley, CRNAPre-anesthesia Checklist: Patient identified, Patient being monitored, Timeout performed, Emergency Drugs available and Suction available Patient Re-evaluated:Patient Re-evaluated prior to induction Oxygen Delivery Method: Circle system utilized Preoxygenation: Pre-oxygenation with 100% oxygen Induction Type: IV induction Ventilation: Mask ventilation without difficulty Laryngoscope Size: Mac and 3 Grade View: Grade I Tube type: Oral Tube size: 7.0 mm Number of attempts: 1 Airway Equipment and Method: Stylet Placement Confirmation: ETT inserted through vocal cords under direct vision, positive ETCO2 and breath sounds checked- equal and bilateral Secured at: 21 cm Tube secured with: Tape Dental Injury: Teeth and Oropharynx as per pre-operative assessment

## 2022-08-15 NOTE — Transfer of Care (Signed)
Immediate Anesthesia Transfer of Care Note  Patient: Sandra Hess  Procedure(s) Performed: XI ROBOTIC ASSISTED LAPAROSCOPIC CHOLECYSTECTOMY INDOCYANINE GREEN FLUORESCENCE IMAGING (ICG)  Patient Location: PACU  Anesthesia Type:General  Level of Consciousness: awake and pateint uncooperative  Airway & Oxygen Therapy: Patient Spontanous Breathing and Patient connected to face mask oxygen  Post-op Assessment: Report given to RN, Post -op Vital signs reviewed and stable, and Patient moving all extremities  Post vital signs: Reviewed and stable  Last Vitals:  Vitals Value Taken Time  BP 147/75 08/15/22 0855  Temp    Pulse 82 08/15/22 0859  Resp 23 08/15/22 0859  SpO2 100 % 08/15/22 0859  Vitals shown include unvalidated device data.  Last Pain:  Vitals:   08/15/22 0710  PainSc: 0-No pain         Complications: No notable events documented.

## 2022-08-16 LAB — SURGICAL PATHOLOGY

## 2022-08-16 NOTE — Anesthesia Postprocedure Evaluation (Signed)
Anesthesia Post Note  Patient: Sandra Hess  Procedure(s) Performed: XI ROBOTIC ASSISTED LAPAROSCOPIC CHOLECYSTECTOMY INDOCYANINE GREEN FLUORESCENCE IMAGING (ICG)  Patient location during evaluation: PACU Anesthesia Type: General Level of consciousness: awake and alert Pain management: pain level controlled Vital Signs Assessment: post-procedure vital signs reviewed and stable Respiratory status: spontaneous breathing, nonlabored ventilation, respiratory function stable and patient connected to nasal cannula oxygen Cardiovascular status: blood pressure returned to baseline and stable Postop Assessment: no apparent nausea or vomiting Anesthetic complications: no   No notable events documented.   Last Vitals:  Vitals:   08/15/22 0947 08/15/22 1000  BP: 130/67 (!) 128/55  Pulse: 75 (!) 59  Resp: 19 18  Temp: 36.7 C 37.1 C  SpO2: 100% 98%    Last Pain:  Vitals:   08/15/22 1000  TempSrc: Temporal  PainSc: 0-No pain                 Cleda Mccreedy Ellison Leisure

## 2022-08-29 ENCOUNTER — Encounter: Payer: Self-pay | Admitting: Physician Assistant

## 2022-08-29 ENCOUNTER — Ambulatory Visit (INDEPENDENT_AMBULATORY_CARE_PROVIDER_SITE_OTHER): Payer: BC Managed Care – PPO | Admitting: Physician Assistant

## 2022-08-29 VITALS — BP 142/80 | HR 64 | Temp 98.0°F | Ht 61.0 in | Wt 126.0 lb

## 2022-08-29 DIAGNOSIS — K8010 Calculus of gallbladder with chronic cholecystitis without obstruction: Secondary | ICD-10-CM

## 2022-08-29 DIAGNOSIS — K8020 Calculus of gallbladder without cholecystitis without obstruction: Secondary | ICD-10-CM

## 2022-08-29 DIAGNOSIS — Z09 Encounter for follow-up examination after completed treatment for conditions other than malignant neoplasm: Secondary | ICD-10-CM

## 2022-08-29 NOTE — Progress Notes (Signed)
[facility] POST-OP OFFICE VISIT  08/29/2022  HPI: Sandra Hess is a 66 y.o. female 14 days s/p robotic assisted laparoscopic cholecystectomy for biliary colic with Dr Everlene Farrier   She has done remarkably well No issues with abdominal pain No fever, chills, nausea, emesis, or diarrhea Tolerating PO; no restrictions  Incisions healed without incident   Vital signs: BP (!) 142/80   Pulse 64   Temp 98 F (36.7 C)   Ht 5\' 1"  (1.549 m)   Wt 126 lb (57.2 kg)   SpO2 97%   BMI 23.81 kg/m    Physical Exam: Constitutional: Well appearing female, NAD Abdomen: Soft, non-tender, non-distended, no rebound/guarding Skin: Laparoscopic incisions are healing well, no erythema or drainage   Assessment/Plan: This is a 66 y.o. female 14 days s/p robotic assisted laparoscopic cholecystectomy for biliary colic with Dr 64    - Pain control prn  - Reviewed wound care recommendation  - Reviewed lifting restrictions; 4 weeks total  - Reviewed surgical pathology; Mild CCC  - She can follow up on as needed basis; She understands to call with questions/concerns  -- Everlene Farrier, PA-C [facility] 08/29/2022, 2:13 PM M-F: 7am - 4pm

## 2022-08-29 NOTE — Patient Instructions (Signed)

## 2023-12-09 ENCOUNTER — Ambulatory Visit (INDEPENDENT_AMBULATORY_CARE_PROVIDER_SITE_OTHER): Payer: Self-pay | Admitting: Family Medicine

## 2023-12-09 ENCOUNTER — Encounter: Payer: Self-pay | Admitting: Family Medicine

## 2023-12-09 VITALS — BP 134/55 | HR 62 | Ht 61.0 in | Wt 127.0 lb

## 2023-12-09 DIAGNOSIS — K219 Gastro-esophageal reflux disease without esophagitis: Secondary | ICD-10-CM

## 2023-12-09 DIAGNOSIS — Z5320 Procedure and treatment not carried out because of patient's decision for unspecified reasons: Secondary | ICD-10-CM | POA: Insufficient documentation

## 2023-12-09 DIAGNOSIS — G8929 Other chronic pain: Secondary | ICD-10-CM

## 2023-12-09 DIAGNOSIS — Z862 Personal history of diseases of the blood and blood-forming organs and certain disorders involving the immune mechanism: Secondary | ICD-10-CM

## 2023-12-09 DIAGNOSIS — F17210 Nicotine dependence, cigarettes, uncomplicated: Secondary | ICD-10-CM | POA: Insufficient documentation

## 2023-12-09 DIAGNOSIS — R7303 Prediabetes: Secondary | ICD-10-CM

## 2023-12-09 DIAGNOSIS — Z7689 Persons encountering health services in other specified circumstances: Secondary | ICD-10-CM | POA: Insufficient documentation

## 2023-12-09 DIAGNOSIS — E039 Hypothyroidism, unspecified: Secondary | ICD-10-CM

## 2023-12-09 DIAGNOSIS — R030 Elevated blood-pressure reading, without diagnosis of hypertension: Secondary | ICD-10-CM

## 2023-12-09 DIAGNOSIS — Z114 Encounter for screening for human immunodeficiency virus [HIV]: Secondary | ICD-10-CM

## 2023-12-09 DIAGNOSIS — Z1231 Encounter for screening mammogram for malignant neoplasm of breast: Secondary | ICD-10-CM

## 2023-12-09 DIAGNOSIS — M546 Pain in thoracic spine: Secondary | ICD-10-CM

## 2023-12-09 DIAGNOSIS — N62 Hypertrophy of breast: Secondary | ICD-10-CM | POA: Insufficient documentation

## 2023-12-09 DIAGNOSIS — Z1159 Encounter for screening for other viral diseases: Secondary | ICD-10-CM | POA: Diagnosis not present

## 2023-12-09 DIAGNOSIS — Z79890 Hormone replacement therapy (postmenopausal): Secondary | ICD-10-CM

## 2023-12-09 NOTE — Assessment & Plan Note (Addendum)
 SBP elevated today = 134/55 GOAL<119/79 DASH diet, exercise, and tobacco use decrease recommended Pt states increased stressed at job Monitor at home w/ upper arm cuff.  Will check Cmp and lipid today

## 2023-12-09 NOTE — Assessment & Plan Note (Signed)
 No changes in breast concerns today, but large pendulous breasts causing intermittent redness and yeast build up in summer Will put deodorant under breasts and has nystatin power at home Denies need for medication today Would like referral to plastic surgery for eval on reduction

## 2023-12-09 NOTE — Addendum Note (Signed)
 Addended by: Charlcie Cradle on: 12/09/2023 12:47 PM   Modules accepted: Level of Service

## 2023-12-09 NOTE — Assessment & Plan Note (Signed)
 Decline colon cancer screening Aware screening catches before symptoms Pt saw adoptive mother go through cancer treatment - something she does not want to go through Aware of risks of not being screening Will continue to educate and reassess pts decision at next vision

## 2023-12-09 NOTE — Assessment & Plan Note (Signed)
 Chronic, stable Continue Estradiol 1mg  daily for menopause symptom control No uterus, no need for progesterone at this time Mammo ordered for breast CA cancer screening

## 2023-12-09 NOTE — Assessment & Plan Note (Signed)
 Chronic,  Pre-contemplation Risks dicussed Uses both cigarettes and vaping products [id number] Decline dexa screening today

## 2023-12-09 NOTE — Progress Notes (Signed)
 New Patient Office Visit  Introduced to nurse practitioner role and practice setting.  All questions answered.  Discussed provider/patient relationship and expectations.   Subjective    Patient ID: Sandra Hess, female    DOB: 04-09-1956  Age: 68 y.o. MRN: [id number]  CC:  Chief Complaint  Patient presents with   Establish Care    No concerns    Health Maintenance    Pt has denied all screening and  vaccines     HPI Sandra Hess presents for reestablishing care and routine follow-up. She was previously under the care of Dr. Henrene Dodge, went to a different practice for awhile now back to [facility].  She has a history of hypothyroidism and is currently taking Synthroid 88 mcg daily. Her dosage has remained unchanged for years. Denies symptoms today.  She has a history of anemia related to a past hysterectomy, during which she received a blood transfusion due to significant blood loss. She no longer has a uterus, ovaries, or cervix, having opted for a total hysterectomy to avoid future surgeries. Takes daily Estrace.   She experienced kidney stones last in 2015, prior to moving to [place] in 2016. She also has a history of gallstones and underwent laparoscopic gallbladder removal surgery, with no issues since the procedure.  She has a hiatal hernia that occasionally causes heartburn depending on her diet, but it is not as severe as it once was.  She smokes two cigarettes a day, usually in the morning, and then switches to vaping. She does not use chewing tobacco.  She reports a family history of diabetes in her biological mother, who developed it later in life. She was screened for diabetes over a year ago and had an A1c of 5.7.  She experiences rashes under her breasts, particularly in hot weather, and has considered breast reduction surgery. She has discussed this with her husband and is considering a referral for further evaluation.  She has a supportive family and  social network, including three children and sixteen grandchildren, 3 great grandchildren. 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 She enjoys making soap and watching TV shows like 'The Voice' and [employer] movies.  Outpatient Encounter Medications as of 12/09/2023  Medication Sig   estradiol (ESTRACE) 1 MG tablet Take 1 mg by mouth daily.   levothyroxine (SYNTHROID) 88 MCG tablet Take 88 mcg by mouth daily before breakfast.   Multiple Vitamins-Minerals (EQL ONE DAILY WOMENS 50+ ADV) TABS Take 1 tablet by mouth daily.   [DISCONTINUED] Calcium Carb-Cholecalciferol (CALTRATE 600+D3) 600-800 MG-UNIT TABS Take 1 tablet by mouth daily.   [DISCONTINUED] ondansetron (ZOFRAN-ODT) 4 MG disintegrating tablet Take 1 tablet (4 mg total) by mouth every 8 (eight) hours as needed.   No facility-administered encounter medications on file as of 12/09/2023.    Past Medical History:  Diagnosis Date   Allergy    Anemia    Blood transfusion without reported diagnosis    Cigarette smoker    History of kidney stones    Hypothyroidism    Kidney calculus     Past Surgical History:  Procedure Laterality Date   ABDOMINAL HYSTERECTOMY     CHOLECYSTECTOMY, LAPAROSCOPIC      Family History  Adopted: Yes  Problem Relation Age of Onset   Heart disease Mother    Stroke Mother    Stroke Father        deceased at 80 y/o   Asthma Sister  deceased at 41 y/o   Healthy Son    Healthy Son    Healthy Son     Social History   Socioeconomic History   Marital status: Married    Spouse name: Not on file   Number of children: Not on file   Years of education: Not on file   Highest education level: 12th grade  Occupational History   Not on file  Tobacco Use   Smoking status: Every Day    Current packs/day: 0.25    Average packs/day: 0.3 packs/day for 40.0 years (10.0 ttl pk-yrs)    Types: Cigarettes, E-cigarettes   Smokeless tobacco: Never  Vaping Use   Vaping status: Some  Days  Substance and Sexual Activity   Alcohol use: Yes    Alcohol/week: 1.0 standard drink of alcohol    Types: 1 Glasses of wine per week   Drug use: Never   Sexual activity: Not on file  Other Topics Concern   Not on file  Social History Narrative   Not on file   Social Drivers of Health   Financial Resource Strain: Low Risk  (12/05/2023)   Overall Financial Resource Strain (CARDIA)    Difficulty of Paying Living Expenses: Not hard at all  Food Insecurity: No Food Insecurity (12/05/2023)   Hunger Vital Sign    Worried About Running Out of Food in the Last Year: Never true    Ran Out of Food in the Last Year: Never true  Transportation Needs: No Transportation Needs (12/05/2023)   PRAPARE - Administrator, Civil Service (Medical): No    Lack of Transportation (Non-Medical): No  Physical Activity: Unknown (12/05/2023)   Exercise Vital Sign    Days of Exercise per Week: 0 days    Minutes of Exercise per Session: Not on file  Stress: No Stress Concern Present (12/05/2023)   [employer] of Occupational Health - Occupational Stress Questionnaire    Feeling of Stress : Not at all  Social Connections: Socially Integrated (12/05/2023)   Social Connection and Isolation Panel [NHANES]    Frequency of Communication with Friends and Family: More than three times a week    Frequency of Social Gatherings with Friends and Family: More than three times a week    Attends Religious Services: More than 4 times per year    Active Member of [employer] or Organizations: Yes    Attends Banker Meetings: More than 4 times per year    Marital Status: Married  Catering manager Violence: Not At Risk (12/09/2023)   Humiliation, Afraid, Rape, and Kick questionnaire    Fear of Current or Ex-Partner: No    Emotionally Abused: No    Physically Abused: No    Sexually Abused: No    Review of Systems  All other systems reviewed and are negative.     Objective    BP (!) 134/55    Pulse 62   Ht 5\' 1"  (1.549 m)   Wt 127 lb (57.6 kg)   SpO2 100%   BMI 24.00 kg/m   Physical Exam Vitals reviewed.  Constitutional:      General: She is not in acute distress.    Appearance: Normal appearance. She is normal weight. She is not toxic-appearing or diaphoretic.  HENT:     Head: Normocephalic.     Right Ear: Tympanic membrane normal.     Left Ear: Tympanic membrane normal.     Nose: Nose normal.     Mouth/Throat:  Mouth: Mucous membranes are moist.     Pharynx: Oropharynx is clear.  Eyes:     Extraocular Movements: Extraocular movements intact.     Conjunctiva/sclera: Conjunctivae normal.     Pupils: Pupils are equal, round, and reactive to light.  Neck:     Thyroid: No thyroid mass, thyromegaly or thyroid tenderness.     Vascular: No carotid bruit.  Cardiovascular:     Rate and Rhythm: Normal rate and regular rhythm.     Pulses: Normal pulses.     Heart sounds: Normal heart sounds. No murmur heard.    No friction rub. No gallop.  Pulmonary:     Effort: No respiratory distress.     Breath sounds: No stridor. No wheezing, rhonchi or rales.  Chest:     Chest wall: No tenderness.  Breasts:    Breasts are symmetrical.     Comments: No skin breakdown or erythema bilaterally under breasts. Denies any concerning breast issues otherwise.  Abdominal:     Tenderness: There is no right CVA tenderness or left CVA tenderness.  Musculoskeletal:     Cervical back: No rigidity or tenderness.     Right lower leg: No edema.     Left lower leg: No edema.  Lymphadenopathy:     Cervical: No cervical adenopathy.  Skin:    General: Skin is warm and dry.     Capillary Refill: Capillary refill takes less than 2 seconds.  Neurological:     General: No focal deficit present.     Mental Status: She is alert and oriented to person, place, and time. Mental status is at baseline.     GCS: GCS eye subscore is 4. GCS verbal subscore is 5. GCS motor subscore is 6.  Psychiatric:         Attention and Perception: Attention and perception normal.        Mood and Affect: Mood normal.        Speech: Speech normal.        Behavior: Behavior normal.        Thought Content: Thought content normal.        Cognition and Memory: Cognition and memory normal.        Judgment: Judgment normal.       Assessment & Plan:   Acquired hypothyroidism Assessment & Plan: Chronic, stable Continue Levothyroxine today Will check TSH and adjust dose as needed, Symptom free today.  Orders: -     TSH  Prediabetes Assessment & Plan: Last A1c 5.7 Will recheck today Continue to make conscious decisions for well balanced diet smaller portions with increase protein, fruits, veggies, water as drink of choice, decrease starches, processed foods, and saturated fats. Increase weekly exercise - 150 minutes per week.    Orders: -     Hemoglobin A1c  Elevated blood pressure reading in office without diagnosis of hypertension Assessment & Plan: SBP elevated today = 134/55 GOAL<119/79 DASH diet, exercise, and tobacco use decrease recommended Pt states increased stressed at job Monitor at home w/ upper arm cuff.  Will check Cmp and lipid today  Orders: -     Lipid panel -     Comprehensive metabolic panel  Tobacco dependence due to cigarettes Assessment & Plan: Chronic,  Pre-contemplation Risks dicussed Uses both cigarettes and vaping products [id number] Decline dexa screening today    Encounter to establish care with new doctor Assessment & Plan: Welcome to [facility]  Reestablishing care Would like referral to plastics for breast  reduction eval Mammo screening ordered Total hysterectomy - no PAP needed Declined Dexa today - will think about it Decline Colon Ca screening  Routine labs  F/u 6 months for physical   Large breasts Assessment & Plan: No changes in breast concerns today, but large pendulous breasts causing intermittent redness and yeast build up in  summer Will put deodorant under breasts and has nystatin power at home Denies need for medication today Would like referral to plastic surgery for eval on reduction  Orders: -     Ambulatory referral to Plastic Surgery  Chronic bilateral thoracic back pain Assessment & Plan: Due to large breast per patient Would like referral to plastic surgery for eval  Orders: -     Ambulatory referral to Plastic Surgery  Encounter for hepatitis C screening test for low risk patient -     Hepatitis C antibody  Screening for HIV (human immunodeficiency virus) -     HIV Antibody (routine testing w rflx)  History of anemia -     CBC  Screening mammogram for breast cancer -     3D Screening Mammogram, Left and Right; Future  Colon cancer screening declined Assessment & Plan: Decline colon cancer screening Aware screening catches before symptoms Pt saw adoptive mother go through cancer treatment - something she does not want to go through Aware of risks of not being screening Will continue to educate and reassess pts decision at next vision   Gastroesophageal reflux disease without esophagitis Assessment & Plan: Hx of hiatial hernia None bothersome States managed.   Postmenopausal HRT (hormone replacement therapy) Assessment & Plan: Chronic, stable Continue Estradiol 1mg  daily for menopause symptom control No uterus, no need for progesterone at this time Mammo ordered for breast CA cancer screening    Return in about 6 months (around 06/10/2024) for annual physical.   I, Sallee Provencal, FNP, have reviewed all documentation for this visit. The documentation on 12/09/23 for the exam, diagnosis, procedures, and orders are all accurate and complete.   Sallee Provencal, FNP

## 2023-12-09 NOTE — Assessment & Plan Note (Signed)
 Last A1c 5.7 Will recheck today Continue to make conscious decisions for well balanced diet smaller portions with increase protein, fruits, veggies, water as drink of choice, decrease starches, processed foods, and saturated fats. Increase weekly exercise - 150 minutes per week.

## 2023-12-09 NOTE — Assessment & Plan Note (Signed)
 Chronic, stable Continue Levothyroxine today Will check TSH and adjust dose as needed, Symptom free today.

## 2023-12-09 NOTE — Assessment & Plan Note (Signed)
 Welcome to [facility]  Reestablishing care Would like referral to plastics for breast reduction eval Mammo screening ordered Total hysterectomy - no PAP needed Declined Dexa today - will think about it Decline Colon Ca screening  Routine labs  F/u 6 months for physical

## 2023-12-09 NOTE — Assessment & Plan Note (Signed)
 Due to large breast per patient Would like referral to plastic surgery for eval

## 2023-12-09 NOTE — Assessment & Plan Note (Signed)
 Hx of hiatial hernia None bothersome States managed.

## 2023-12-10 ENCOUNTER — Encounter: Payer: Self-pay | Admitting: Family Medicine

## 2023-12-13 LAB — COMPREHENSIVE METABOLIC PANEL
ALT: 16 IU/L (ref 0–32)
AST: 22 IU/L (ref 0–40)
Albumin: 4.4 g/dL (ref 3.9–4.9)
Alkaline Phosphatase: 103 IU/L (ref 44–121)
BUN/Creatinine Ratio: 11 — ABNORMAL LOW (ref 12–28)
BUN: 9 mg/dL (ref 8–27)
Bilirubin Total: 0.4 mg/dL (ref 0.0–1.2)
CO2: 21 mmol/L (ref 20–29)
Calcium: 9.9 mg/dL (ref 8.7–10.3)
Chloride: 104 mmol/L (ref 96–106)
Creatinine, Ser: 0.85 mg/dL (ref 0.57–1.00)
Globulin, Total: 2.3 g/dL (ref 1.5–4.5)
Glucose: 87 mg/dL (ref 70–99)
Potassium: 4.7 mmol/L (ref 3.5–5.2)
Sodium: 143 mmol/L (ref 134–144)
Total Protein: 6.7 g/dL (ref 6.0–8.5)
eGFR: 76 mL/min/{1.73_m2} (ref >59)

## 2023-12-13 LAB — HIV ANTIBODY (ROUTINE TESTING W REFLEX): HIV Screen 4th Generation wRfx: NONREACTIVE

## 2023-12-13 LAB — LIPID PANEL
Chol/HDL Ratio: 2.9 ratio (ref 0.0–4.4)
Cholesterol, Total: 205 mg/dL — ABNORMAL HIGH (ref 100–199)
HDL: 70 mg/dL (ref >39)
LDL Chol Calc (NIH): 111 mg/dL — ABNORMAL HIGH (ref 0–99)
Triglycerides: 138 mg/dL (ref 0–149)
VLDL Cholesterol Cal: 24 mg/dL (ref 5–40)

## 2023-12-13 LAB — CBC
Hematocrit: 40.7 % (ref 34.0–46.6)
Hemoglobin: 13.5 g/dL (ref 11.1–15.9)
MCH: 31.8 pg (ref 26.6–33.0)
MCHC: 33.2 g/dL (ref 31.5–35.7)
MCV: 96 fL (ref 79–97)
Platelets: 333 10*3/uL (ref 150–450)
RBC: 4.25 x10E6/uL (ref 3.77–5.28)
RDW: 13.1 % (ref 11.7–15.4)
WBC: 5.8 10*3/uL (ref 3.4–10.8)

## 2023-12-13 LAB — HEMOGLOBIN A1C
Est. average glucose Bld gHb Est-mCnc: 108 mg/dL
Hgb A1c MFr Bld: 5.4 % (ref 4.8–5.6)

## 2023-12-13 LAB — HEPATITIS C ANTIBODY: Hep C Virus Ab: NONREACTIVE

## 2023-12-13 LAB — TSH: TSH: 0.349 u[IU]/mL — ABNORMAL LOW (ref 0.450–4.500)

## 2023-12-15 ENCOUNTER — Other Ambulatory Visit: Payer: Self-pay | Admitting: Family Medicine

## 2023-12-15 DIAGNOSIS — E7800 Pure hypercholesterolemia, unspecified: Secondary | ICD-10-CM

## 2023-12-15 DIAGNOSIS — E039 Hypothyroidism, unspecified: Secondary | ICD-10-CM

## 2023-12-15 MED ORDER — LEVOTHYROXINE SODIUM 75 MCG PO TABS: 75.0000 ug | ORAL_TABLET | Freq: Every day | ORAL | 3 refills | Status: DC

## 2023-12-15 MED ORDER — ROSUVASTATIN CALCIUM 5 MG PO TABS: 5.0000 mg | ORAL_TABLET | Freq: Every day | ORAL | 3 refills | Status: AC

## 2023-12-15 NOTE — Telephone Encounter (Signed)
New rx has been sent to pharmacy

## 2024-02-02 ENCOUNTER — Ambulatory Visit (INDEPENDENT_AMBULATORY_CARE_PROVIDER_SITE_OTHER): Admitting: Plastic Surgery

## 2024-02-02 ENCOUNTER — Encounter: Payer: Self-pay | Admitting: Plastic Surgery

## 2024-02-02 VITALS — BP 126/76 | HR 75 | Ht 60.0 in | Wt 129.2 lb

## 2024-02-02 DIAGNOSIS — N62 Hypertrophy of breast: Secondary | ICD-10-CM

## 2024-02-02 DIAGNOSIS — F17210 Nicotine dependence, cigarettes, uncomplicated: Secondary | ICD-10-CM

## 2024-02-02 DIAGNOSIS — M542 Cervicalgia: Secondary | ICD-10-CM

## 2024-02-02 NOTE — Progress Notes (Unsigned)
   Referring Provider Tasia Farr, FNP [address] [place],  [place] [address]   CC:  Chief Complaint  Patient presents with   Consult      Sandra Hess is an 68 y.o. female.  HPI: Sandra Hess is a 68 year old female who presents today with complaints of upper back and neck pain difficulty finding bras that fit appropriately and grooves from her bra straps in her shoulders which she attributes to the large size of her breast.  She states that this has been a problem now for 2 years.  She is interested in bilateral breast reduction.  No Known Allergies  Outpatient Encounter Medications as of 02/02/2024  Medication Sig   estradiol (ESTRACE) 1 MG tablet Take 1 mg by mouth daily.   levothyroxine  (SYNTHROID ) 75 MCG tablet Take 1 tablet (75 mcg total) by mouth daily.   Multiple Vitamins-Minerals (EQL ONE DAILY WOMENS 50+ ADV) TABS Take 1 tablet by mouth daily.   rosuvastatin  (CRESTOR ) 5 MG tablet Take 1 tablet (5 mg total) by mouth daily.   No facility-administered encounter medications on file as of 02/02/2024.     Past Medical History:  Diagnosis Date   Allergy    Anemia    Blood transfusion without reported diagnosis    Cigarette smoker    History of kidney stones    Hypothyroidism    Kidney calculus     Past Surgical History:  Procedure Laterality Date   ABDOMINAL HYSTERECTOMY     CHOLECYSTECTOMY, LAPAROSCOPIC      Family History  Adopted: Yes  Problem Relation Age of Onset   Heart disease Mother    Stroke Mother    Stroke Father        deceased at 43 y/o   Asthma Sister        deceased at 78 y/o   Healthy Son    Healthy Son    Healthy Son     Social History   Social History Narrative   Not on file     Review of Systems General: Denies fevers, chills, weight loss CV: Denies chest pain, shortness of breath, palpitations ***  Physical Exam    02/02/2024   10:12 AM 12/09/2023    9:35 AM 08/29/2022    2:11 PM  Vitals with BMI  Height 5'  0" 5\' 1"  5\' 1"   Weight 129 lbs 3 oz 127 lbs 126 lbs  BMI 25.23 24.01 23.82  Systolic 126 134 191  Diastolic 76 55 80  Pulse 75 62 64    General:  No acute distress,  Alert and oriented, Non-Toxic, Normal speech and affect ***  Mammogram: ***  Assessment/Plan ***  Sandra Hess 02/02/2024, 11:20 AM

## 2024-02-05 ENCOUNTER — Telehealth: Payer: Self-pay | Admitting: *Deleted

## 2024-02-05 NOTE — Telephone Encounter (Signed)
 Called and spoke with the patient regarding the message below pertaining to getting a Mammogram before we can do her breast reduction.   Patient verbalized understanding and stated that she will be going out of town, but she will reach out to getting her mammogram scheduled.//AB/CMA

## 2024-02-05 NOTE — Telephone Encounter (Signed)
-----   Message from Teretha Ferguson sent at 02/04/2024  5:20 PM EDT ----- Regarding: Mammogram Ladies,  Ms Sandra Hess has a mammogram  order pending. Will you please let her know that this must be done before we can do her breast reduction?  Thank you, Marylou Sobers

## 2024-02-25 ENCOUNTER — Ambulatory Visit
Admission: RE | Admit: 2024-02-25 | Discharge: 2024-02-25 | Disposition: A | Discharge status: Home / Self Care | Source: Ambulatory Visit | Attending: Family Medicine | Admitting: Family Medicine

## 2024-02-25 DIAGNOSIS — Z1231 Encounter for screening mammogram for malignant neoplasm of breast: Secondary | ICD-10-CM | POA: Diagnosis not present

## 2024-04-06 NOTE — Progress Notes (Signed)
 Patient ID: Sandra Hess, female    DOB: 1956-01-08, 68 y.o.   MRN: [id number]  No chief complaint on file.   No diagnosis found.   History of Present Illness: Sandra Hess is a 68 y.o.  female  with a history of macromastia.  She presents for preoperative evaluation for upcoming procedure, Bilateral Breast Reduction ***, scheduled for *** with Dr.  {Aojwx:[id number]::Ujbonm,Ipoopwhyjf}  The patient {HAS HAS [id number]} had problems with anesthesia. ***  Summary of Previous Visit: ***  Estimated excess breast tissue to be removed at time of surgery: *** grams  Job: ***  PMH Significant for: ***   Past Medical History: Allergies: No Known Allergies  Current Medications:  Current Outpatient Medications:    estradiol (ESTRACE) 1 MG tablet, Take 1 mg by mouth daily., Disp: , Rfl:    levothyroxine  (SYNTHROID ) 75 MCG tablet, Take 1 tablet (75 mcg total) by mouth daily., Disp: 90 tablet, Rfl: 3   Multiple Vitamins-Minerals (EQL ONE DAILY WOMENS 50+ ADV) TABS, Take 1 tablet by mouth daily., Disp: , Rfl:    rosuvastatin  (CRESTOR ) 5 MG tablet, Take 1 tablet (5 mg total) by mouth daily., Disp: 90 tablet, Rfl: 3  Past Medical Problems: Past Medical History:  Diagnosis Date   Allergy    Anemia    Blood transfusion without reported diagnosis    Cigarette smoker    History of kidney stones    Hypothyroidism    Kidney calculus     Past Surgical History: Past Surgical History:  Procedure Laterality Date   ABDOMINAL HYSTERECTOMY     CHOLECYSTECTOMY, LAPAROSCOPIC      Social History: Social History   Socioeconomic History   Marital status: Married    Spouse name: Not on file   Number of children: Not on file   Years of education: Not on file   Highest education level: 12th grade  Occupational History   Not on file  Tobacco Use   Smoking status: Every Day    Current packs/day: 0.25    Average packs/day: 0.3 packs/day for 40.0 years (10.0 ttl pk-yrs)    Types:  Cigarettes, E-cigarettes   Smokeless tobacco: Never  Vaping Use   Vaping status: Some Days  Substance and Sexual Activity   Alcohol use: Yes    Alcohol/week: 1.0 standard drink of alcohol    Types: 1 Glasses of wine per week   Drug use: Never   Sexual activity: Not on file  Other Topics Concern   Not on file  Social History Narrative   Not on file   Social Drivers of Health   Financial Resource Strain: Low Risk  (12/05/2023)   Overall Financial Resource Strain (CARDIA)    Difficulty of Paying Living Expenses: Not hard at all  Food Insecurity: No Food Insecurity (12/05/2023)   Hunger Vital Sign    Worried About Running Out of Food in the Last Year: Never true    Ran Out of Food in the Last Year: Never true  Transportation Needs: No Transportation Needs (12/05/2023)   PRAPARE - Administrator, Civil Service (Medical): No    Lack of Transportation (Non-Medical): No  Physical Activity: Unknown (12/05/2023)   Exercise Vital Sign    Days of Exercise per Week: 0 days    Minutes of Exercise per Session: Not on file  Stress: No Stress Concern Present (12/05/2023)   [employer] of Occupational Health - Occupational Stress Questionnaire    Feeling of Stress :  Not at all  Social Connections: Socially Integrated (12/05/2023)   Social Connection and Isolation Panel    Frequency of Communication with Friends and Family: More than three times a week    Frequency of Social Gatherings with Friends and Family: More than three times a week    Attends Religious Services: More than 4 times per year    Active Member of [employer] or Organizations: Yes    Attends Banker Meetings: More than 4 times per year    Marital Status: Married  Catering manager Violence: Not At Risk (12/09/2023)   Humiliation, Afraid, Rape, and Kick questionnaire    Fear of Current or Ex-Partner: No    Emotionally Abused: No    Physically Abused: No    Sexually Abused: No    Family History: Family  History  Adopted: Yes  Problem Relation Age of Onset   Heart disease Mother    Stroke Mother    Stroke Father        deceased at 64 y/o   Asthma Sister        deceased at 69 y/o   Healthy Son    Healthy Son    Healthy Son     Review of Systems: ROS  Physical Exam: Vital Signs There were no vitals taken for this visit.  Physical Exam *** Constitutional:      General: Not in acute distress.    Appearance: Normal appearance. Not ill-appearing.  HENT:     Head: Normocephalic and atraumatic.  Eyes:     Pupils: Pupils are equal, round Neck:     Musculoskeletal: Normal range of motion.  Cardiovascular:     Rate and Rhythm: Normal rate    Pulses: Normal pulses.  Pulmonary:     Effort: Pulmonary effort is normal. No respiratory distress.  Musculoskeletal: Normal range of motion.  Skin:    General: Skin is warm and dry.     Findings: No erythema or rash.  Neurological:     General: No focal deficit present.     Mental Status: Alert and oriented to person, place, and time. Mental status is at baseline.     Motor: No weakness.  Psychiatric:        Mood and Affect: Mood normal.        Behavior: Behavior normal.    Assessment/Plan: The patient is scheduled for bilateral breast reduction with Dr. {Aojwx:[id number]::Ujbonm,Ipoopwhyjf}.  Risks, benefits, and alternatives of procedure discussed, questions answered and consent obtained.    Smoking Status: ***; Counseling Given? *** Last Mammogram: ***; Results: ***  Caprini Score: ***; Risk Factors include: ***, BMI *** 25, and length of planned surgery. Recommendation for mechanical *** pharmacological prophylaxis. Encourage early ambulation.   Pictures obtained: @consult ***  Post-op Rx sent to pharmacy: {Blank:[id number]::Oxycodone , Zofran , Keflex,Oxycodone , Zofran }  Patient was provided with the breast reduction and General Surgical Risk consent document and Pain Medication Agreement prior to their appointment.  They had  adequate time to read through the risk consent documents and Pain Medication Agreement. We also discussed them in person together during this preop appointment. All of their questions were answered to their satisfaction.  Recommended calling if they have any further questions.  Risk consent form and Pain Medication Agreement to be scanned into patient's chart.  The risk that can be encountered with breast reduction were discussed and include the following but not limited to these:  Breast asymmetry, fluid accumulation, firmness of the breast, inability to breast feed, loss of nipple  or areola, skin loss, decrease or no nipple sensation, fat necrosis of the breast tissue, bleeding, infection, healing delay.  There are risks of anesthesia, changes to skin sensation and injury to nerves or blood vessels.  The muscle can be temporarily or permanently injured.  You may have an allergic reaction to tape, suture, glue, blood products which can result in skin discoloration, swelling, pain, skin lesions, poor healing.  Any of these can lead to the need for revisonal surgery or stage procedures.  A reduction has potential to interfere with diagnostic procedures.  Nipple or breast piercing can increase risks of infection.  This procedure is best done when the breast is fully developed.  Changes in the breast will continue to occur over time.  Pregnancy can alter the outcomes of previous breast reduction surgery, weight gain and weigh loss can also effect the long term appearance.     Electronically signed by: Donnice PARAS Taiven Greenley, PA-C 04/06/2024 9:29 PM

## 2024-04-06 NOTE — H&P (View-Only) (Signed)
 Patient ID: Sandra Hess, female    DOB: 1956-01-08, 68 y.o.   MRN: [id number]  No chief complaint on file.   No diagnosis found.   History of Present Illness: Sandra Hess is a 68 y.o.  female  with a history of macromastia.  She presents for preoperative evaluation for upcoming procedure, Bilateral Breast Reduction ***, scheduled for *** with Dr.  {Aojwx:[id number]::Ujbonm,Ipoopwhyjf}  The patient {HAS HAS [id number]} had problems with anesthesia. ***  Summary of Previous Visit: ***  Estimated excess breast tissue to be removed at time of surgery: *** grams  Job: ***  PMH Significant for: ***   Past Medical History: Allergies: No Known Allergies  Current Medications:  Current Outpatient Medications:    estradiol (ESTRACE) 1 MG tablet, Take 1 mg by mouth daily., Disp: , Rfl:    levothyroxine  (SYNTHROID ) 75 MCG tablet, Take 1 tablet (75 mcg total) by mouth daily., Disp: 90 tablet, Rfl: 3   Multiple Vitamins-Minerals (EQL ONE DAILY WOMENS 50+ ADV) TABS, Take 1 tablet by mouth daily., Disp: , Rfl:    rosuvastatin  (CRESTOR ) 5 MG tablet, Take 1 tablet (5 mg total) by mouth daily., Disp: 90 tablet, Rfl: 3  Past Medical Problems: Past Medical History:  Diagnosis Date   Allergy    Anemia    Blood transfusion without reported diagnosis    Cigarette smoker    History of kidney stones    Hypothyroidism    Kidney calculus     Past Surgical History: Past Surgical History:  Procedure Laterality Date   ABDOMINAL HYSTERECTOMY     CHOLECYSTECTOMY, LAPAROSCOPIC      Social History: Social History   Socioeconomic History   Marital status: Married    Spouse name: Not on file   Number of children: Not on file   Years of education: Not on file   Highest education level: 12th grade  Occupational History   Not on file  Tobacco Use   Smoking status: Every Day    Current packs/day: 0.25    Average packs/day: 0.3 packs/day for 40.0 years (10.0 ttl pk-yrs)    Types:  Cigarettes, E-cigarettes   Smokeless tobacco: Never  Vaping Use   Vaping status: Some Days  Substance and Sexual Activity   Alcohol use: Yes    Alcohol/week: 1.0 standard drink of alcohol    Types: 1 Glasses of wine per week   Drug use: Never   Sexual activity: Not on file  Other Topics Concern   Not on file  Social History Narrative   Not on file   Social Drivers of Health   Financial Resource Strain: Low Risk  (12/05/2023)   Overall Financial Resource Strain (CARDIA)    Difficulty of Paying Living Expenses: Not hard at all  Food Insecurity: No Food Insecurity (12/05/2023)   Hunger Vital Sign    Worried About Running Out of Food in the Last Year: Never true    Ran Out of Food in the Last Year: Never true  Transportation Needs: No Transportation Needs (12/05/2023)   PRAPARE - Administrator, Civil Service (Medical): No    Lack of Transportation (Non-Medical): No  Physical Activity: Unknown (12/05/2023)   Exercise Vital Sign    Days of Exercise per Week: 0 days    Minutes of Exercise per Session: Not on file  Stress: No Stress Concern Present (12/05/2023)   [employer] of Occupational Health - Occupational Stress Questionnaire    Feeling of Stress :  Not at all  Social Connections: Socially Integrated (12/05/2023)   Social Connection and Isolation Panel    Frequency of Communication with Friends and Family: More than three times a week    Frequency of Social Gatherings with Friends and Family: More than three times a week    Attends Religious Services: More than 4 times per year    Active Member of [employer] or Organizations: Yes    Attends Banker Meetings: More than 4 times per year    Marital Status: Married  Catering manager Violence: Not At Risk (12/09/2023)   Humiliation, Afraid, Rape, and Kick questionnaire    Fear of Current or Ex-Partner: No    Emotionally Abused: No    Physically Abused: No    Sexually Abused: No    Family History: Family  History  Adopted: Yes  Problem Relation Age of Onset   Heart disease Mother    Stroke Mother    Stroke Father        deceased at 64 y/o   Asthma Sister        deceased at 69 y/o   Healthy Son    Healthy Son    Healthy Son     Review of Systems: ROS  Physical Exam: Vital Signs There were no vitals taken for this visit.  Physical Exam *** Constitutional:      General: Not in acute distress.    Appearance: Normal appearance. Not ill-appearing.  HENT:     Head: Normocephalic and atraumatic.  Eyes:     Pupils: Pupils are equal, round Neck:     Musculoskeletal: Normal range of motion.  Cardiovascular:     Rate and Rhythm: Normal rate    Pulses: Normal pulses.  Pulmonary:     Effort: Pulmonary effort is normal. No respiratory distress.  Musculoskeletal: Normal range of motion.  Skin:    General: Skin is warm and dry.     Findings: No erythema or rash.  Neurological:     General: No focal deficit present.     Mental Status: Alert and oriented to person, place, and time. Mental status is at baseline.     Motor: No weakness.  Psychiatric:        Mood and Affect: Mood normal.        Behavior: Behavior normal.    Assessment/Plan: The patient is scheduled for bilateral breast reduction with Dr. {Aojwx:[id number]::Ujbonm,Ipoopwhyjf}.  Risks, benefits, and alternatives of procedure discussed, questions answered and consent obtained.    Smoking Status: ***; Counseling Given? *** Last Mammogram: ***; Results: ***  Caprini Score: ***; Risk Factors include: ***, BMI *** 25, and length of planned surgery. Recommendation for mechanical *** pharmacological prophylaxis. Encourage early ambulation.   Pictures obtained: @consult ***  Post-op Rx sent to pharmacy: {Blank:[id number]::Oxycodone , Zofran , Keflex,Oxycodone , Zofran }  Patient was provided with the breast reduction and General Surgical Risk consent document and Pain Medication Agreement prior to their appointment.  They had  adequate time to read through the risk consent documents and Pain Medication Agreement. We also discussed them in person together during this preop appointment. All of their questions were answered to their satisfaction.  Recommended calling if they have any further questions.  Risk consent form and Pain Medication Agreement to be scanned into patient's chart.  The risk that can be encountered with breast reduction were discussed and include the following but not limited to these:  Breast asymmetry, fluid accumulation, firmness of the breast, inability to breast feed, loss of nipple  or areola, skin loss, decrease or no nipple sensation, fat necrosis of the breast tissue, bleeding, infection, healing delay.  There are risks of anesthesia, changes to skin sensation and injury to nerves or blood vessels.  The muscle can be temporarily or permanently injured.  You may have an allergic reaction to tape, suture, glue, blood products which can result in skin discoloration, swelling, pain, skin lesions, poor healing.  Any of these can lead to the need for revisonal surgery or stage procedures.  A reduction has potential to interfere with diagnostic procedures.  Nipple or breast piercing can increase risks of infection.  This procedure is best done when the breast is fully developed.  Changes in the breast will continue to occur over time.  Pregnancy can alter the outcomes of previous breast reduction surgery, weight gain and weigh loss can also effect the long term appearance.     Electronically signed by: Donnice PARAS Taiven Greenley, PA-C 04/06/2024 9:29 PM

## 2024-04-07 ENCOUNTER — Ambulatory Visit (INDEPENDENT_AMBULATORY_CARE_PROVIDER_SITE_OTHER): Admitting: Surgical

## 2024-04-07 ENCOUNTER — Encounter: Payer: Self-pay | Admitting: Surgical

## 2024-04-07 VITALS — BP 164/80 | HR 63 | Wt 137.0 lb

## 2024-04-07 DIAGNOSIS — N62 Hypertrophy of breast: Secondary | ICD-10-CM

## 2024-04-07 DIAGNOSIS — F17211 Nicotine dependence, cigarettes, in remission: Secondary | ICD-10-CM | POA: Diagnosis not present

## 2024-04-07 MED ORDER — ONDANSETRON HCL 4 MG PO TABS: 4.0000 mg | ORAL_TABLET | Freq: Three times a day (TID) | ORAL | 0 refills | Status: DC | PRN

## 2024-04-07 MED ORDER — HYDROCODONE-ACETAMINOPHEN 5-325 MG PO TABS: 1.0000 | ORAL_TABLET | Freq: Four times a day (QID) | ORAL | 0 refills | Status: AC | PRN

## 2024-04-10 LAB — NICOTINE/COTININE METABOLITES
Cotinine: <1 ng/mL
Nicotine: <1 ng/mL

## 2024-04-23 ENCOUNTER — Other Ambulatory Visit: Payer: Self-pay

## 2024-04-23 ENCOUNTER — Encounter (HOSPITAL_BASED_OUTPATIENT_CLINIC_OR_DEPARTMENT_OTHER): Payer: Self-pay | Admitting: Plastic Surgery

## 2024-04-26 MED ORDER — CHLORHEXIDINE GLUCONATE CLOTH 2 % EX PADS: 6.0000 | MEDICATED_PAD | Freq: Once | CUTANEOUS | Status: DC

## 2024-04-26 NOTE — Progress Notes (Signed)

## 2024-04-30 ENCOUNTER — Other Ambulatory Visit: Payer: Self-pay

## 2024-04-30 ENCOUNTER — Ambulatory Visit (HOSPITAL_BASED_OUTPATIENT_CLINIC_OR_DEPARTMENT_OTHER): Admitting: Certified Registered"

## 2024-04-30 ENCOUNTER — Ambulatory Visit (HOSPITAL_BASED_OUTPATIENT_CLINIC_OR_DEPARTMENT_OTHER)
Admission: RE | Admit: 2024-04-30 | Discharge: 2024-04-30 | Disposition: A | Discharge status: Home / Self Care | Attending: Plastic Surgery | Admitting: Plastic Surgery

## 2024-04-30 ENCOUNTER — Encounter (HOSPITAL_BASED_OUTPATIENT_CLINIC_OR_DEPARTMENT_OTHER): Payer: Self-pay | Admitting: Plastic Surgery

## 2024-04-30 ENCOUNTER — Encounter (HOSPITAL_BASED_OUTPATIENT_CLINIC_OR_DEPARTMENT_OTHER)
Admission: RE | Disposition: A | Discharge status: Home / Self Care | Payer: Self-pay | Source: Home / Self Care | Attending: Plastic Surgery

## 2024-04-30 DIAGNOSIS — Z79899 Other long term (current) drug therapy: Secondary | ICD-10-CM | POA: Diagnosis not present

## 2024-04-30 DIAGNOSIS — K219 Gastro-esophageal reflux disease without esophagitis: Secondary | ICD-10-CM | POA: Insufficient documentation

## 2024-04-30 DIAGNOSIS — E039 Hypothyroidism, unspecified: Secondary | ICD-10-CM | POA: Diagnosis not present

## 2024-04-30 DIAGNOSIS — M542 Cervicalgia: Secondary | ICD-10-CM | POA: Diagnosis not present

## 2024-04-30 DIAGNOSIS — Z79890 Hormone replacement therapy (postmenopausal): Secondary | ICD-10-CM | POA: Diagnosis not present

## 2024-04-30 DIAGNOSIS — M546 Pain in thoracic spine: Secondary | ICD-10-CM | POA: Diagnosis not present

## 2024-04-30 DIAGNOSIS — M549 Dorsalgia, unspecified: Secondary | ICD-10-CM | POA: Insufficient documentation

## 2024-04-30 DIAGNOSIS — F17211 Nicotine dependence, cigarettes, in remission: Secondary | ICD-10-CM | POA: Insufficient documentation

## 2024-04-30 DIAGNOSIS — N62 Hypertrophy of breast: Secondary | ICD-10-CM

## 2024-04-30 DIAGNOSIS — N6082 Other benign mammary dysplasias of left breast: Secondary | ICD-10-CM | POA: Insufficient documentation

## 2024-04-30 DIAGNOSIS — Z01818 Encounter for other preprocedural examination: Secondary | ICD-10-CM

## 2024-04-30 DIAGNOSIS — N6021 Fibroadenosis of right breast: Secondary | ICD-10-CM | POA: Insufficient documentation

## 2024-04-30 HISTORY — PX: BREAST REDUCTION SURGERY: SHX8

## 2024-04-30 HISTORY — DX: Personal history of nicotine dependence: Z87.891

## 2024-04-30 HISTORY — DX: Hyperlipidemia, unspecified: E78.5

## 2024-04-30 MED ORDER — OXYCODONE HCL 5 MG/5ML PO SOLN: 5.0000 mg | Freq: Once | ORAL | Status: AC | PRN

## 2024-04-30 MED ORDER — FENTANYL CITRATE (PF) 100 MCG/2ML IJ SOLN
INTRAMUSCULAR | Status: AC
Start: 2024-04-30 — End: 2024-04-30
  Filled 2024-04-30: qty 2

## 2024-04-30 MED ORDER — HYDROMORPHONE HCL 1 MG/ML IJ SOLN
INTRAMUSCULAR | Status: AC
Start: 2024-04-30 — End: 2024-04-30
  Filled 2024-04-30: qty 0.5

## 2024-04-30 MED ORDER — OXYCODONE HCL 5 MG PO TABS
ORAL_TABLET | ORAL | Status: AC
  Filled 2024-04-30: qty 1

## 2024-04-30 MED ORDER — HYDROMORPHONE HCL 1 MG/ML IJ SOLN
0.2500 mg | INTRAMUSCULAR | Status: DC | PRN
  Administered 2024-04-30 (×2): 0.5 mg via INTRAVENOUS

## 2024-04-30 MED ORDER — LACTATED RINGERS IV SOLN: INTRAVENOUS | Status: DC

## 2024-04-30 MED ORDER — OXYCODONE HCL 5 MG PO TABS
5.0000 mg | ORAL_TABLET | Freq: Once | ORAL | Status: AC | PRN
  Administered 2024-04-30: 5 mg via ORAL

## 2024-04-30 MED ORDER — ROCURONIUM BROMIDE 10 MG/ML (PF) SYRINGE
PREFILLED_SYRINGE | INTRAVENOUS | Status: AC
  Filled 2024-04-30: qty 10

## 2024-04-30 MED ORDER — ROCURONIUM BROMIDE 10 MG/ML (PF) SYRINGE
PREFILLED_SYRINGE | INTRAVENOUS | Status: DC | PRN
Start: 2024-04-30 — End: 2024-04-30
  Administered 2024-04-30: 60 mg via INTRAVENOUS

## 2024-04-30 MED ORDER — FENTANYL CITRATE (PF) 100 MCG/2ML IJ SOLN
INTRAMUSCULAR | Status: DC | PRN
  Administered 2024-04-30: 50 ug via INTRAVENOUS
  Administered 2024-04-30 (×2): 25 ug via INTRAVENOUS
  Administered 2024-04-30: 50 ug via INTRAVENOUS
  Administered 2024-04-30 (×2): 25 ug via INTRAVENOUS

## 2024-04-30 MED ORDER — LIDOCAINE 2% (20 MG/ML) 5 ML SYRINGE
INTRAMUSCULAR | Status: AC
  Filled 2024-04-30: qty 5

## 2024-04-30 MED ORDER — SUGAMMADEX SODIUM 200 MG/2ML IV SOLN
INTRAVENOUS | Status: DC | PRN
Start: 2024-04-30 — End: 2024-04-30
  Administered 2024-04-30: 150 mg via INTRAVENOUS

## 2024-04-30 MED ORDER — ACETAMINOPHEN 500 MG PO TABS
1000.0000 mg | ORAL_TABLET | Freq: Once | ORAL | Status: AC
  Administered 2024-04-30: 1000 mg via ORAL

## 2024-04-30 MED ORDER — 0.9 % SODIUM CHLORIDE (POUR BTL) OPTIME
TOPICAL | Status: DC | PRN
  Administered 2024-04-30 (×2): 1000 mL

## 2024-04-30 MED ORDER — NITROGLYCERIN 2 % TD OINT
TOPICAL_OINTMENT | TRANSDERMAL | Status: AC
  Filled 2024-04-30: qty 30

## 2024-04-30 MED ORDER — FENTANYL CITRATE (PF) 100 MCG/2ML IJ SOLN
INTRAMUSCULAR | Status: AC
  Filled 2024-04-30: qty 2

## 2024-04-30 MED ORDER — LIDOCAINE 2% (20 MG/ML) 5 ML SYRINGE
INTRAMUSCULAR | Status: DC | PRN
  Administered 2024-04-30: 40 mg via INTRAVENOUS

## 2024-04-30 MED ORDER — MIDAZOLAM HCL 2 MG/2ML IJ SOLN: 0.5000 mg | Freq: Once | INTRAMUSCULAR | Status: DC | PRN

## 2024-04-30 MED ORDER — CEFAZOLIN SODIUM-DEXTROSE 2-4 GM/100ML-% IV SOLN
2.0000 g | INTRAVENOUS | Status: AC
  Administered 2024-04-30: 2 g via INTRAVENOUS

## 2024-04-30 MED ORDER — CEFAZOLIN SODIUM-DEXTROSE 2-4 GM/100ML-% IV SOLN
INTRAVENOUS | Status: AC
Start: 2024-04-30 — End: 2024-04-30
  Filled 2024-04-30: qty 100

## 2024-04-30 MED ORDER — ONDANSETRON HCL 4 MG/2ML IJ SOLN
INTRAMUSCULAR | Status: DC | PRN
  Administered 2024-04-30: 4 mg via INTRAVENOUS

## 2024-04-30 MED ORDER — MIDAZOLAM HCL 2 MG/2ML IJ SOLN
INTRAMUSCULAR | Status: AC
  Filled 2024-04-30: qty 2

## 2024-04-30 MED ORDER — PROPOFOL 10 MG/ML IV BOLUS
INTRAVENOUS | Status: DC | PRN
  Administered 2024-04-30: 200 mg via INTRAVENOUS

## 2024-04-30 MED ORDER — EPHEDRINE 5 MG/ML INJ
INTRAVENOUS | Status: AC
  Filled 2024-04-30: qty 5

## 2024-04-30 MED ORDER — ACETAMINOPHEN 500 MG PO TABS
ORAL_TABLET | ORAL | Status: AC
Start: 2024-04-30 — End: 2024-04-30
  Filled 2024-04-30: qty 2

## 2024-04-30 MED ORDER — MIDAZOLAM HCL 2 MG/2ML IJ SOLN
INTRAMUSCULAR | Status: DC | PRN
Start: 2024-04-30 — End: 2024-04-30
  Administered 2024-04-30: 2 mg via INTRAVENOUS

## 2024-04-30 MED ORDER — PROPOFOL 10 MG/ML IV BOLUS
INTRAVENOUS | Status: AC
  Filled 2024-04-30: qty 20

## 2024-04-30 MED ORDER — EPHEDRINE SULFATE-NACL 50-0.9 MG/10ML-% IV SOSY
PREFILLED_SYRINGE | INTRAVENOUS | Status: DC | PRN
Start: 2024-04-30 — End: 2024-04-30
  Administered 2024-04-30: 10 mg via INTRAVENOUS

## 2024-04-30 MED ORDER — SODIUM CHLORIDE (PF) 0.9 % IJ SOLN
INTRAMUSCULAR | Status: DC | PRN
  Administered 2024-04-30: 100 mL

## 2024-04-30 MED ORDER — DEXAMETHASONE SODIUM PHOSPHATE 10 MG/ML IJ SOLN
INTRAMUSCULAR | Status: AC
  Filled 2024-04-30: qty 1

## 2024-04-30 MED ORDER — NITROGLYCERIN 2 % TD OINT
TOPICAL_OINTMENT | TRANSDERMAL | Status: DC | PRN
  Administered 2024-04-30: .5 [in_us] via TOPICAL

## 2024-04-30 MED ORDER — DEXAMETHASONE SODIUM PHOSPHATE 10 MG/ML IJ SOLN
INTRAMUSCULAR | Status: DC | PRN
  Administered 2024-04-30: 5 mg via INTRAVENOUS

## 2024-04-30 MED ORDER — HYDROMORPHONE HCL 1 MG/ML IJ SOLN
INTRAMUSCULAR | Status: AC
  Filled 2024-04-30: qty 0.5

## 2024-04-30 MED ORDER — ONDANSETRON HCL 4 MG/2ML IJ SOLN
INTRAMUSCULAR | Status: AC
  Filled 2024-04-30: qty 2

## 2024-04-30 SURGICAL SUPPLY — 1 items: NDL HYPO 22X1.5 SAFETY MO (MISCELLANEOUS) ×2 IMPLANT

## 2024-04-30 NOTE — Anesthesia Procedure Notes (Signed)
 Procedure Name: Intubation Date/Time: 04/30/2024 11:36 AM  Performed by: Delayne Olam BIRCH, CRNAPre-anesthesia Checklist: Patient identified, Emergency Drugs available, Suction available and Patient being monitored Patient Re-evaluated:Patient Re-evaluated prior to induction Oxygen Delivery Method: Circle system utilized Preoxygenation: Pre-oxygenation with 100% oxygen Induction Type: IV induction Ventilation: Mask ventilation without difficulty Laryngoscope Size: Mac and 3 Grade View: Grade I Tube type: Oral Tube size: 7.0 mm Number of attempts: 1 Airway Equipment and Method: Stylet and Oral airway Placement Confirmation: ETT inserted through vocal cords under direct vision, positive ETCO2 and breath sounds checked- equal and bilateral Secured at: 22 cm Tube secured with: Tape Dental Injury: Teeth and Oropharynx as per pre-operative assessment

## 2024-04-30 NOTE — Discharge Instructions (Addendum)
 Continue with Nitropaste starting tomorrow morning and re-apply every 12 hours until Sunday PM. Apply first 1/2 inch of paste to LEFT nipple only around 8-10am Saturday 05/01/2024.     INSTRUCTIONS FOR AFTER BREAST SURGERY   You will likely have some questions about what to expect following your operation.  The following information will help you and your family understand what to expect when you are discharged from the hospital.  It is important to follow these guidelines to help ensure a smooth recovery and reduce complication.  Postoperative instructions include information on: diet, wound care, medications and physical activity.  AFTER SURGERY Expect to go home after the procedure.  In some cases, you may need to spend one night in the hospital for observation.  DIET Breast surgery does not require a specific diet.  However, the healthier you eat the better your body will heal. It is important to increasing your protein intake.  This means limiting the foods with sugar and carbohydrates.  Focus on vegetables and some meat.  If you have liposuction during your procedure be sure to drink water.  If your urine is bright yellow, then it is concentrated, and you need to drink more water.  As a general rule after surgery, you should have 8 ounces of water every hour while awake.  If you find you are persistently nauseated or unable to take in liquids let us  know.  NO TOBACCO USE or EXPOSURE.  This will slow your healing process and lead to a wound.  WOUND CARE Leave the binder on at all times except when showering . Use fragrance free soap like Dial, Dove or [place].   After 24 hours you can remove the binder to shower. Once dry apply binder or sports bra. No baths, pools or hot tubs for four weeks. We close your incision to leave the smallest and best-looking scar. No ointment or creams on your incisions for four weeks.  No Neosporin (Too many skin reactions).  A few weeks after surgery you can use  Mederma and start massaging the scar. We ask you to wear your binder or sports bra for the first 6 weeks around the clock, including while sleeping. This provides added comfort and helps reduce the fluid accumulation at the surgery site. NO Ice or heating pads to the operative site.  You have a very high risk of a BURN before you feel the temperature change. Continue to empty, recharge, & record drainage from drains 2-3 times a day, as needed.   ACTIVITY No heavy lifting until cleared by the doctor.  This usually means no more than a half-gallon of milk.  It is OK to walk and climb stairs. Moving your legs is very important to decrease your risk of a blood clot.  It will also help keep you from getting deconditioned.  Every 1 to 2 hours get up and walk for 5 minutes. This will help with a quicker recovery back to normal.  Let pain be your guide so you don't do too much.  This time is for you to recover.  You will be more comfortable if you sleep and rest with your head elevated either with a few pillows under you or in a recliner.  No stomach sleeping for a three months.  WORK Everyone returns to work at different times. As a rough guide, most people take at least 1 - 2 weeks off prior to returning to work. If you need documentation for your job, give the forms  to the front staff at the clinic.  DRIVING Arrange for someone to bring you home from the hospital after your surgery.  You may be able to drive a few days after surgery but not while taking any narcotics or valium.  BOWEL MOVEMENTS Constipation can occur after anesthesia and while taking pain medication.  It is important to stay ahead for your comfort.  We recommend taking Milk of Magnesia (2 tablespoons; twice a day) while taking the pain pills.  MEDICATIONS You may be prescribed should start after surgery At your preoperative visit for you history and physical you may have been given the following medications: Zofran  4 mg:  This is to  treat nausea and vomiting.  You can take this every 6 hours as needed and only if needed. Oxycodone  5 mg:  This is only to be used after you have taken the Motrin or the Tylenol . Every 8 hours as needed.   Over the counter Medication to take: Ibuprofen (Motrin) 600 mg:  Take this every 6 hours.  If you have additional pain then take 500 mg of the Tylenol  every 8 hours. (You had a dose of Tylenol  at 10:00am today. Do not take Tylenol  until after 4:00pm today, if needed.) Only take the Norco after you have tried these two. MiraLAX or Milk of Magnesia: Take this according to the bottle if you take the Norco.  WHEN TO CALL Call your surgeon's office if any of the following occur: Fever 101 degrees F or greater Excessive bleeding or fluid from the incision site. Pain that increases over time without aid from the medications Redness, warmth, or pus draining from incision sites Persistent nausea or inability to take in liquids Severe misshapen area that underwent the operation.  Here are some resources for breast cancer patients:  Plastic surgery website: https://www.plasticsurgery.org/for-medical-professionals/education-and-resources/publications/breast-reconstruction-magazine Breast Reconstruction Awareness Campaign:  ChessContest.fr Plastic surgery Implant information:  https://www.plasticsurgery.org/patient-safety/breast-implant-safety   Post Anesthesia Home Care Instructions  Activity: Get plenty of rest for the remainder of the day. A responsible individual must stay with you for 24 hours following the procedure.  For the next 24 hours, DO NOT: -Drive a car -Advertising copywriter -Drink alcoholic beverages -Take any medication unless instructed by your physician -Make any legal decisions or sign important papers.  Meals: Start with liquid foods such as gelatin or soup. Progress to regular foods as tolerated. Avoid greasy, spicy, heavy foods. If nausea and/or vomiting  occur, drink only clear liquids until the nausea and/or vomiting subsides. Call your physician if vomiting continues.  Special Instructions/Symptoms: Your throat may feel dry or sore from the anesthesia or the breathing tube placed in your throat during surgery. If this causes discomfort, gargle with warm salt water. The discomfort should disappear within 24 hours.  If you had a scopolamine patch placed behind your ear for the management of post- operative nausea and/or vomiting:  1. The medication in the patch is effective for 72 hours, after which it should be removed.  Wrap patch in a tissue and discard in the trash. Wash hands thoroughly with soap and water. 2. You may remove the patch earlier than 72 hours if you experience unpleasant side effects which may include dry mouth, dizziness or visual disturbances. 3. Avoid touching the patch. Wash your hands with soap and water after contact with the patch.   Information for Discharge Teaching: EXPAREL  (bupivacaine  liposome injectable suspension)   Pain relief is important to your recovery. The goal is to control your pain so you can  move easier and return to your normal activities as soon as possible after your procedure. Your physician may use several types of medicines to manage pain, swelling, and more.  Your surgeon or anesthesiologist gave you EXPAREL (bupivacaine ) to help control your pain after surgery.  EXPAREL  is a local anesthetic designed to release slowly over an extended period of time to provide pain relief by numbing the tissue around the surgical site. EXPAREL  is designed to release pain medication over time and can control pain for up to 72 hours. Depending on how you respond to EXPAREL , you may require less pain medication during your recovery. EXPAREL  can help reduce or eliminate the need for opioids during the first few days after surgery when pain relief is needed the most. EXPAREL  is not an opioid and is not addictive. It  does not cause sleepiness or sedation.   Important! A teal colored band has been placed on your arm with the date, time and amount of EXPAREL  you have received. Please leave this armband in place for the full 96 hours following administration, and then you may remove the band. If you return to the hospital for any reason within 96 hours following the administration of EXPAREL , the armband provides important information that your health care providers to know, and alerts them that you have received this anesthetic.    Possible side effects of EXPAREL : Temporary loss of sensation or ability to move in the area where medication was injected. Nausea, vomiting, constipation Rarely, numbness and tingling in your mouth or lips, lightheadedness, or anxiety may occur. Call your doctor right away if you think you may be experiencing any of these sensations, or if you have other questions regarding possible side effects.  Follow all other discharge instructions given to you by your surgeon or nurse. Eat a healthy diet and drink plenty of water or other fluids.  About my Jackson-Pratt Bulb Drain  What is a Jackson-Pratt bulb? A Jackson-Pratt is a soft, round device used to collect drainage. It is connected to a long, thin drainage catheter, which is held in place by one or two small stiches near your surgical incision site. When the bulb is squeezed, it forms a vacuum, forcing the drainage to empty into the bulb.  Emptying the Jackson-Pratt bulb- To empty the bulb: 1. Release the plug on the top of the bulb. 2. Pour the bulb's contents into a measuring container which your nurse will provide. 3. Record the time emptied and amount of drainage. Empty the drain(s) as often as your     doctor or nurse recommends.  Date                  Time                    Amount (Drain 1)                 Amount (Drain  2)  _____________________________________________________________________  _____________________________________________________________________  _____________________________________________________________________  _____________________________________________________________________  _____________________________________________________________________  _____________________________________________________________________  _____________________________________________________________________  _____________________________________________________________________  Squeezing the Jackson-Pratt Bulb- To squeeze the bulb: 1. Make sure the plug at the top of the bulb is open. 2. Squeeze the bulb tightly in your fist. You will hear air squeezing from the bulb. 3. Replace the plug while the bulb is squeezed. 4. Use a safety pin to attach the bulb to your clothing. This will keep the catheter from     pulling at the bulb insertion site.  When to call  your doctor- Call your doctor if: Drain site becomes red, swollen or hot. You have a fever greater than 101 degrees F. There is oozing at the drain site. Drain falls out (apply a guaze bandage over the drain hole and secure it with tape). Drainage increases daily not related to activity patterns. (You will usually have more drainage when you are active than when you are resting.) Drainage has a bad odor.

## 2024-04-30 NOTE — Anesthesia Preprocedure Evaluation (Addendum)
 Anesthesia Evaluation  Patient identified by MRN, date of birth, ID band Patient awake    Reviewed: Allergy & Precautions, NPO status , Patient's Chart, lab work & pertinent test results  History of Anesthesia Complications Negative for: history of anesthetic complications  Airway Mallampati: II  TM Distance: >3 FB Neck ROM: Full    Dental  (+) Dental Advisory Given   Pulmonary former smoker   breath sounds clear to auscultation       Cardiovascular negative cardio ROS  Rhythm:Regular Rate:Normal     Neuro/Psych negative neurological ROS     GI/Hepatic Neg liver ROS,GERD  Controlled,,  Endo/Other  Hypothyroidism    Renal/GU negative Renal ROS     Musculoskeletal   Abdominal   Peds  Hematology negative hematology ROS (+)   Anesthesia Other Findings   Reproductive/Obstetrics                              Anesthesia Physical Anesthesia Plan  ASA: 2  Anesthesia Plan: General   Post-op Pain Management: Tylenol  PO (pre-op)*   Induction: Intravenous  PONV Risk Score and Plan: 3 and Ondansetron , Dexamethasone  and Midazolam   Airway Management Planned: Oral ETT  Additional Equipment: None  Intra-op Plan:   Post-operative Plan: Extubation in OR  Informed Consent: I have reviewed the patients History and Physical, chart, labs and discussed the procedure including the risks, benefits and alternatives for the proposed anesthesia with the patient or authorized representative who has indicated his/her understanding and acceptance.     Dental advisory given  Plan Discussed with: CRNA and Surgeon  Anesthesia Plan Comments:          Anesthesia Quick Evaluation

## 2024-04-30 NOTE — Anesthesia Postprocedure Evaluation (Signed)
 Anesthesia Post Note  Patient: Sandra Hess  Procedure(s) Performed: MAMMOPLASTY, REDUCTION (Bilateral: Breast)     Patient location during evaluation: PACU Anesthesia Type: General Level of consciousness: awake and alert, oriented and patient cooperative Pain management: pain level controlled Vital Signs Assessment: post-procedure vital signs reviewed and stable Respiratory status: spontaneous breathing, nonlabored ventilation and respiratory function stable Cardiovascular status: blood pressure returned to baseline and stable Postop Assessment: no apparent nausea or vomiting and able to ambulate Anesthetic complications: no   No notable events documented.  Last Vitals:  Vitals:   04/30/24 1445 04/30/24 1500  BP: 131/72 131/69  Pulse: 77 71  Resp: 16 14  Temp:    SpO2: 99% 94%    Last Pain:  Vitals:   04/30/24 1500  TempSrc:   PainSc: Asleep                 Antaeus Karel,E. Carlisia Geno

## 2024-04-30 NOTE — Op Note (Signed)
 DATE OF OPERATION: 04/30/2024   LOCATION: [facility] operating Room   PREOPERATIVE DIAGNOSIS: Symptomatic macromastia   POSTOPERATIVE DIAGNOSIS: Same   PROCEDURE: Bilateral breast reduction   SURGEON: Marinell Birmingham, MD   ASSISTANT: Matt Scheeler   EBL: 100 cc   CONDITION: Stable   COMPLICATIONS: None   INDICATION: The patient, Sandra Hess, is a 68 y.o. female born on 30-Jul-1956, is here for treatment upper back and neck pain secondary to large breast size.     PROCEDURE DETAILS:  The patient was seen prior to surgery and marked.  IV antibiotics were given. The patient was taken to the operating room,SCDs were placed, and given a general anesthetic. A standard time out was performed and all information was confirmed by those in the room.    The chest was prepped and draped in usual sterile manner.  A 42 mm cookie cutter was used to outline the proposed nipple areolar complexes bilaterally and an 8 cm based inferior pedicle was outlined on each breast.  The right breast was addressed first.  Laparotomy tape was placed at the base of breast as a tourniquet and the pedicle was de-epithelialized sharply.  Electrocautery was used to dissect the borders of the pedicle to the chest wall.  Electrocautery was then used to resect the medial lateral and superior triangles of breast tissue and to develop within the superior skin flap.  The breast tissue removed constituted the bulk of the reduction and weighed 504 gms.  The subfascial space over the pectoralis muscle and the subcutaneous tissues were infiltrated with 50 mL of a mixture of Exparel , quarter percent plain Marcaine , and saline.  The surgical wound was irrigated and hemostasis ensured with electrocautery.  A 19 French round drain was placed behind the pedicle and brought out through a separate stab incision.  The T point was approximated with a single 2-0 Monocryl suture and the skin edges were tailor tacked in place with skin  clips.  Dermis was approximated with interrupted and running 3-0 Monocryl sutures and the skin was closed with a running 4-0 Monocryl subcuticular stitch.  Attention was turned to the left breast where similar procedure was performed.  After placing a laparotomy tape at the base of the breast as a tourniquet the pedicle was de-epithelialized sharply and dissected to the chest wall with electrocautery.  Medial lateral and superior triangles of breast tissue were resected and the superior skin flap was developed and thinned.  The breast tissue removed constituted the bulk of the reduction and weighed 503 gms.  All breast tissue was sent to pathology for routine examination.  Again the subfascial space was infiltrated with the Exparel  mixture and the surgical bed irrigated.  After ensuring hemostasis a 19 French round drain was placed behind the pedicle and brought out through a separate stab incision. The T point was approximated with a 2-0 Monocryl suture and the skin edges were tailor tacked in place with skin clips.  Dermis was closed with interrupted and running 3-0 Monocryl sutures and skin was closed with running 4-0 Monocryl subcuticular stitch.  All incisions were then sealed with Dermabond the patient was placed in a supportive, compressive garment.  She was awakened from anesthesia without incident and transferred to the recovery room in good condition.  All instrument needle and sponge counts were reported as correct and there were no complications appreciated during the procedure.  I was concerned that there may be congestion within the left nipple and areola  complex.  I elected to place 1/2 inch of Nitropaste on the nipple and areola.  The patient will place 1/2 inch on the nipple and areola every 12 hours for the next 2 days and will be reevaluated on Monday. The patient was allowed to wake up and taken to recovery room in stable condition at the end of the case. The family was notified at the end of  the case.    The advanced practice practitioner (APP) assisted throughout the case.  The APP was essential in retraction and counter traction when needed to make the case progress smoothly.  This retraction and assistance made it possible to see the tissue plans for the procedure.  The assistance was needed for blood control, tissue re-approximation and assisted with closure of the incision site.

## 2024-04-30 NOTE — Interval H&P Note (Signed)
 History and Physical Interval Note: No change in exam or indication for surgery All questions answered Marked for a bilateral breast reduction with her assistance Will proceed at her request  04/30/2024 11:05 AM  Sandra Hess  has presented today for surgery, with the diagnosis of n62.  The various methods of treatment have been discussed with the patient and family. After consideration of risks, benefits and other options for treatment, the patient has consented to  Procedure(s): MAMMOPLASTY, REDUCTION (Bilateral) as a surgical intervention.  The patient's history has been reviewed, patient examined, no change in status, stable for surgery.  I have reviewed the patient's chart and labs.  Questions were answered to the patient's satisfaction.     [name]

## 2024-04-30 NOTE — Transfer of Care (Signed)
 Immediate Anesthesia Transfer of Care Note  Patient: Sandra Hess  Procedure(s) Performed: Procedure(s) (LRB): MAMMOPLASTY, REDUCTION (Bilateral)  Patient Location: PACU  Anesthesia Type: General  Level of Consciousness: awake, oriented, sedated and patient cooperative  Airway & Oxygen Therapy: Patient Spontanous Breathing and Patient connected to face mask oxygen  Post-op Assessment: Report given to PACU RN and Post -op Vital signs reviewed and stable  Post vital signs: Reviewed and stable  Complications: No apparent anesthesia complications Last Vitals:  Vitals Value Taken Time  BP 138/66 04/30/24 14:30  Temp    Pulse 71 04/30/24 14:31  Resp 15 04/30/24 14:31  SpO2 100 % 04/30/24 14:31  Vitals shown include unfiled device data.  Last Pain:  Vitals:   04/30/24 1001  TempSrc: Temporal  PainSc: 0-No pain         Complications: No notable events documented.

## 2024-05-01 ENCOUNTER — Encounter (HOSPITAL_BASED_OUTPATIENT_CLINIC_OR_DEPARTMENT_OTHER): Payer: Self-pay | Admitting: Plastic Surgery

## 2024-05-03 ENCOUNTER — Encounter: Payer: Self-pay | Admitting: Surgical

## 2024-05-03 ENCOUNTER — Ambulatory Visit: Admitting: Surgical

## 2024-05-03 ENCOUNTER — Encounter: Payer: Self-pay | Admitting: Plastic Surgery

## 2024-05-03 VITALS — BP 155/92 | HR 66

## 2024-05-03 DIAGNOSIS — F17211 Nicotine dependence, cigarettes, in remission: Secondary | ICD-10-CM

## 2024-05-03 DIAGNOSIS — N62 Hypertrophy of breast: Secondary | ICD-10-CM

## 2024-05-03 NOTE — Progress Notes (Signed)
 Patient is a 68 y.o.-year-old female status post bilateral breast reduction with Dr.  Waddell. Patient is 3 days postop.  Patient did do Nitropaste postoperatively to the left nipple areola, she tolerated this well.  She reports she has been up and walking without issue.  She does report she did have some difficulty sleeping last night but is otherwise doing well.  She does not report any infectious symptoms.   Chaperone present on exam Bilateral NAC's are viable, bilateral breast incisions are intact. There is no erythema or cellulitic changes noted. No obvious subcutaneous fluid collections noted with palpation. Patient does have bruising to bilateral NAC's and breast incisions.  Bilateral NAC's with sensation and are warm and have good capillary refill and color.  A/P:  Recommend continuing with compressive garment 24/7 until 6 weeks post-op,  avoiding strenuous activity/heavy lifting until 6 weeks post-op  Recommend following up in 1 week  All of the patient's questions were answered to their content. Recommend calling with any questions or concerns.  Pictures were obtained of the patient and placed in the chart with the patient's or guardian's permission.

## 2024-05-04 LAB — SURGICAL PATHOLOGY

## 2024-05-10 ENCOUNTER — Encounter: Payer: Self-pay | Admitting: Plastic Surgery

## 2024-05-10 ENCOUNTER — Ambulatory Visit: Admitting: Plastic Surgery

## 2024-05-10 VITALS — BP 147/74 | HR 62 | Ht 60.0 in | Wt 134.0 lb

## 2024-05-10 DIAGNOSIS — Z9889 Other specified postprocedural states: Secondary | ICD-10-CM

## 2024-05-10 NOTE — Progress Notes (Addendum)
         Ms. Sandra Hess returns today approximately 10 days postop from bilateral breast reduction.  She is doing very well with no complaints and is very happy with her results.  She notes that she has normal feeling both nipples.  She had very little postoperative pain requiring only 2 oxycodone  postoperatively.  She is not taking Tylenol  or Motrin as needed  On physical exam she has excellent shape and symmetry.  The nipples are warm and well-perfused.  All incisions are clean dry and intact.  She will begin scar massage next week.  She will slowly begin to increase activity.  She will keep her follow-up appointment at the end of this month.  Return sooner for any questions or concerns  Provided with a return to work note.

## 2024-05-24 ENCOUNTER — Ambulatory Visit (INDEPENDENT_AMBULATORY_CARE_PROVIDER_SITE_OTHER): Admitting: Physician Assistant

## 2024-05-24 VITALS — BP 147/81 | HR 55

## 2024-05-24 DIAGNOSIS — Z9889 Other specified postprocedural states: Secondary | ICD-10-CM

## 2024-05-24 NOTE — Progress Notes (Signed)
 Patient is a 68 year old female s/p bilateral breast reduction performed 04/30/2024 by Dr. Waddell who presents to clinic for postoperative follow-up.  She was last seen here in clinic on 05/10/2024.  At that time, she was doing well from postoperative standpoint.  Today, patient is doing excellent from a postoperative standpoint.  She is pleased with the new size and shape of her breast.  She denies any chest pain, difficulty breathing, leg swelling, fevers, or other concerns.  She is using body butter on her incisions throughout.  On exam, breasts have excellent shape and symmetry.  Soft throughout.  NAC's are healthy and viable.  She does have a small, less than 1 cm incisional wound right inferior T-zone with overlying hardened slough.  No surrounding erythema or palpable underlying fluid.  Similar less than 0.5 cm incisional wound with hardened overlying slough left inferior T-zone.  Informed patient that they may begin to soften and drain spontaneously as part of the healing process, but otherwise she looks excellent from a postoperative standpoint.  Recommend that she follow-up in 2 weeks for likely final encounter and transition to silicone scar gels.  Continue with activity modifications and compressive garments in interim.  Picture(s) obtained of the patient and placed in the chart were with the patient's or guardian's permission.

## 2024-06-09 NOTE — Progress Notes (Signed)
 Patient is a 68 year old female who underwent bilateral breast reduction with Dr. Waddell on 04/30/2024.  Patient is almost 6 weeks postop.  She presents to the clinic today for postoperative follow-up.  Patient was last seen in the clinic on 05/24/2024.  At this visit, patient was doing well.  She was pleased with her result.  On exam, patient had excellent shape and symmetry.  Breasts were soft throughout.  NAC's were healthy and viable.  Today, patient reports she is doing well.  She states that her wound is still present to the right inferior T-zone.  She reports she has been applying Aquaphor to it once daily.  She otherwise denies any new issues or concerns.  She denies any fevers or chills.  Chaperone present on exam.  On exam, patient is sitting upright in no acute distress.  Breasts are overall soft and symmetric.  There is a little bit of firmness noted laterally to the bilateral breast, consistent with scar tissue.  There are no overlying skin changes.  There is no overlying erythema to either breast.  No obvious fluid collection palpated on exam.  NAC's are healthy bilaterally.  There is a small 0.5 x 0.5 cm wound noted at the right inferior T-zone.  There does appear to be a little bit of granulation tissue trying to grow into the wound.  There is no active drainage.  No surrounding erythema.  Remainder of the incisions are intact and healing well.  Recommended that patient clean the wound with Vashe daily.  For about the next week or so, she may apply a small piece of Xeroform over the wound followed by a dressing daily.  Discussed with her that when she is out of the Xeroform, she may transition back to Aquaphor.  She expressed understanding.  Discussed with patient that she does not have to wear her compression bra anymore and may transition into a regular bra without underwire.  Discussed with her she may start gradually increasing her activities.  Patient expressed understanding.  Patient  to follow back up in about 3 weeks.  I instructed her to call if she has any questions or concerns about anything.

## 2024-06-10 ENCOUNTER — Ambulatory Visit (INDEPENDENT_AMBULATORY_CARE_PROVIDER_SITE_OTHER): Admitting: Family Medicine

## 2024-06-10 ENCOUNTER — Encounter: Payer: Self-pay | Admitting: Family Medicine

## 2024-06-10 ENCOUNTER — Ambulatory Visit (INDEPENDENT_AMBULATORY_CARE_PROVIDER_SITE_OTHER): Admitting: Student

## 2024-06-10 VITALS — BP 147/58 | HR 61 | Temp 98.2°F | Ht 61.0 in | Wt 138.7 lb

## 2024-06-10 VITALS — BP 144/81 | HR 65

## 2024-06-10 DIAGNOSIS — Z87891 Personal history of nicotine dependence: Secondary | ICD-10-CM

## 2024-06-10 DIAGNOSIS — R030 Elevated blood-pressure reading, without diagnosis of hypertension: Secondary | ICD-10-CM | POA: Diagnosis not present

## 2024-06-10 DIAGNOSIS — Z2821 Immunization not carried out because of patient refusal: Secondary | ICD-10-CM

## 2024-06-10 DIAGNOSIS — E663 Overweight: Secondary | ICD-10-CM | POA: Diagnosis not present

## 2024-06-10 DIAGNOSIS — E039 Hypothyroidism, unspecified: Secondary | ICD-10-CM | POA: Diagnosis not present

## 2024-06-10 DIAGNOSIS — Z9882 Breast implant status: Secondary | ICD-10-CM

## 2024-06-10 DIAGNOSIS — Z9889 Other specified postprocedural states: Secondary | ICD-10-CM

## 2024-06-10 DIAGNOSIS — E7800 Pure hypercholesterolemia, unspecified: Secondary | ICD-10-CM

## 2024-06-10 DIAGNOSIS — Z6826 Body mass index (BMI) 26.0-26.9, adult: Secondary | ICD-10-CM

## 2024-06-10 NOTE — Progress Notes (Signed)
 Established Patient Office Visit  Introduced to nurse practitioner role and practice setting.  All questions answered.  Discussed provider/patient relationship and expectations.  Subjective   Patient ID: Sandra Hess, female    DOB: 02/26/1956  Age: 68 y.o. MRN: [id number]  Chief Complaint  Patient presents with   Hypothyroidism    Subjective:   Sandra Hess is a 68 y.o. female who presents for follow up of hypothyroidism. Current symptoms: weight changes due to quit smoking.  Patient states that she is tolerating medication well.    Medical Management of Chronic Issues    Declined all vaccines.  Reports that the right incision seems to be having a hard time closing but she has an appointment with them today.  Dexa Scan-okay to order    Discussed the use of AI scribe software for clinical note transcription with the patient, who gave verbal consent to proceed.  History of Present Illness Sandra Hess is a 68 year old female who presents for chronic disease mgmt  She underwent breast augmentation on August 1st  -  incision on R breast, not completely closed and is still draining. Decreased in size, and she is using Aquaphor. Occasionally, the area aches. She has been using gauze to cover the incision - she sees surgery today for a follow up on it.  She reports an increase in her blood pressure, which she attributes to pain and possibly weight gain. She notes a weight gain of ten pounds and has not been able to exercise due to her recent surgery. Her blood pressure was notably high during a previous visit when she was in pain, reaching 160/100. Historically, her blood pressure was normal, but recent readings have shown elevated systolic values. She has a home blood pressure cuff but has not been actively monitoring her blood pressure at home.  She recently started taking Crestor  and her levothyroxine  dose was adjusted to 75 mcg. She is concerned about her weight gain and its impact  on her health, noting that her clothes no longer fit and it is affecting her hysterectomy scar. 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  She is planning a cruise to the [place] in December for her 40th wedding anniversary, which she is looking forward to as a break from her current routine.      06/10/2024    8:31 AM 12/09/2023    9:35 AM 03/13/2021    9:12 AM  Depression screen PHQ 2/9  Decreased Interest 0 0 0  Down, Depressed, Hopeless 0 0 0  PHQ - 2 Score 0 0 0  Altered sleeping 0 1 0  Tired, decreased energy 0 0 0  Change in appetite 0 0 0  Feeling bad or failure about yourself  0 0 0  Trouble concentrating 0 0 0  Moving slowly or fidgety/restless 0 0 0  Suicidal thoughts 0 0 0  PHQ-9 Score 0 1 0  Difficult doing work/chores Not difficult at all Not difficult at all Not difficult at all       06/10/2024    8:31 AM 12/09/2023    9:35 AM  GAD 7 : Generalized Anxiety Score  Nervous, Anxious, on Edge 0 0  Control/stop worrying 0 0  Worry too much - different things 0 0  Trouble relaxing 0 0  Restless 0 0  Easily annoyed or irritable 0 0  Afraid -  awful might happen 0 0  Total GAD 7 Score 0 0  Anxiety Difficulty Not difficult at all Not difficult at all     Review of Systems  All other systems reviewed and are negative.   Negative unless indicated in HPI   Objective:     BP (!) 147/58 (BP Location: Left Arm, Patient Position: Sitting, Cuff Size: Normal)   Pulse 61   Temp 98.2 F (36.8 C) (Oral)   Ht 5' 1 (1.549 m)   Wt 138 lb 11.2 oz (62.9 kg)   SpO2 100%   BMI 26.21 kg/m    Physical Exam Constitutional:      General: She is not in acute distress.    Appearance: Normal appearance. She is normal weight. She is not ill-appearing, toxic-appearing or diaphoretic.  HENT:     Head: Normocephalic.     Right Ear: Tympanic membrane normal.     Left Ear: Tympanic  membrane normal.     Nose: Nose normal. No congestion or rhinorrhea.     Mouth/Throat:     Mouth: Mucous membranes are moist.     Pharynx: Oropharynx is clear.  Eyes:     Extraocular Movements: Extraocular movements intact.     Pupils: Pupils are equal, round, and reactive to light.  Cardiovascular:     Rate and Rhythm: Normal rate and regular rhythm.     Pulses: Normal pulses.     Heart sounds: Normal heart sounds. No murmur heard.    No friction rub. No gallop.  Pulmonary:     Effort: No respiratory distress.     Breath sounds: No stridor. No wheezing, rhonchi or rales.  Chest:     Chest wall: No tenderness.  Musculoskeletal:     Right lower leg: No edema.     Left lower leg: No edema.  Skin:    General: Skin is warm and dry.     Capillary Refill: Capillary refill takes less than 2 seconds.  Neurological:     General: No focal deficit present.     Mental Status: She is alert and oriented to person, place, and time. Mental status is at baseline.     Cranial Nerves: No cranial nerve deficit.     Motor: No weakness.     Gait: Gait normal.  Psychiatric:        Attention and Perception: Attention and perception normal.        Mood and Affect: Mood and affect normal.        Speech: Speech normal.        Behavior: Behavior normal. Behavior is cooperative.        Thought Content: Thought content normal.        Cognition and Memory: Cognition and memory normal.        Judgment: Judgment normal.      No results found for any visits on 06/10/24.    The 10-year ASCVD risk score (Arnett DK, et al., 2019) is: 6.6%    Assessment & Plan:  Acquired hypothyroidism -     TSH  Hypercholesterolemia -     Lipid panel  Elevated blood pressure reading in office without diagnosis of hypertension -     Comprehensive metabolic panel with GFR  Overweight with body mass index (BMI) of 26 to 26.9 in adult  Cessation of tobacco use in previous 12 months  Status post breast  augmentation  Flu vaccine refused     Assessment and Plan Assessment & Plan Elevated  blood pressure readings w/o Diag of HTN Blood pressure readings have been elevated, with systolic readings being the primary concern. Pain and stress may contribute to the elevation.  GOAL<120/80 - Monitor blood pressure at home 3-4 times a week and keep a log. - Follow up in 4 weeks to review blood pressure log and assess the need for medication if systolic readings remain above 130s/80s - Encourage lifestyle modifications, including reducing salt intake, increasing physical activity, and monitoring caffeine consumption.  Overweight; BMI = 26.21 She has gained 10 pounds since last visit in March 2025, and is experiencing difficulty with exercise due to postoperative recovery. She is aware of the need to lose weight and is considering lifestyle changes. - Concerns for sedentary career - Encourage gradual increase in physical activity as recovery allows. - Discuss dietary modifications, including reducing salt intake and avoiding processed foods.  Hypercholesterolemia She has started Crestor  5mg  for hypercholesterolemia.  Lipid levels need to be monitored to assess the effectiveness of the medication. - Check lipid panel today. - Continue Crestor  5mg  daily  Hypothyroidism Levothyroxine  dosage was adjusted to 75 mcg at the last visit, March 2025. She is due for a recheck of thyroid  levels to assess the effectiveness of the current dosage. - Recheck thyroid  levels today. - Continue levothyroxine  75 mcg daily, will adjust dose if needed  Status Post Breast Augmentation The right breast incision from surgery on August 1st is not completely closed per patient, and is still draining, though it has decreased in size. The left side has healed. Has been working with her surgeons. Has appointment today to follow up on healing process.  - Continue using Aquaphor and gauze on the incision, could try unscented  panty liner. - Use a hair dryer on a cool setting to dry the area after showering - discuss with surgeon.  Tobacco Cessation - Congrats!! Keep up the great work!  General maintenance - UTD on mammo - Will do Dexa scan in 2026 per patient  Declines flu vaccine today   Return in about 4 weeks (around 07/08/2024) for BP Check.   I, Curtis DELENA Boom, FNP, have reviewed all documentation for this visit. The documentation on 06/10/24 for the exam, diagnosis, procedures, and orders are all accurate and complete.   Curtis DELENA Boom, FNP

## 2024-06-11 ENCOUNTER — Ambulatory Visit: Payer: Self-pay | Admitting: Family Medicine

## 2024-06-11 DIAGNOSIS — E039 Hypothyroidism, unspecified: Secondary | ICD-10-CM

## 2024-06-11 LAB — LIPID PANEL
Chol/HDL Ratio: 2.2 ratio (ref 0.0–4.4)
Cholesterol, Total: 187 mg/dL (ref 100–199)
HDL: 84 mg/dL (ref >39)
LDL Chol Calc (NIH): 81 mg/dL (ref 0–99)
Triglycerides: 130 mg/dL (ref 0–149)
VLDL Cholesterol Cal: 22 mg/dL (ref 5–40)

## 2024-06-11 LAB — COMPREHENSIVE METABOLIC PANEL WITH GFR
ALT: 18 IU/L (ref 0–32)
AST: 23 IU/L (ref 0–40)
Albumin: 4.5 g/dL (ref 3.9–4.9)
Alkaline Phosphatase: 106 IU/L (ref 44–121)
BUN/Creatinine Ratio: 12 (ref 12–28)
BUN: 11 mg/dL (ref 8–27)
Bilirubin Total: 0.4 mg/dL (ref 0.0–1.2)
CO2: 22 mmol/L (ref 20–29)
Calcium: 9.9 mg/dL (ref 8.7–10.3)
Chloride: 102 mmol/L (ref 96–106)
Creatinine, Ser: 0.91 mg/dL (ref 0.57–1.00)
Globulin, Total: 2.4 g/dL (ref 1.5–4.5)
Glucose: 82 mg/dL (ref 70–99)
Potassium: 4.8 mmol/L (ref 3.5–5.2)
Sodium: 142 mmol/L (ref 134–144)
Total Protein: 6.9 g/dL (ref 6.0–8.5)
eGFR: 70 mL/min/1.73 (ref >59)

## 2024-06-11 LAB — TSH: TSH: 5.29 u[IU]/mL — ABNORMAL HIGH (ref 0.450–4.500)

## 2024-06-11 MED ORDER — LEVOTHYROXINE SODIUM 88 MCG PO TABS: 88.0000 ug | ORAL_TABLET | Freq: Every day | ORAL | 3 refills | Status: AC

## 2024-06-11 NOTE — Progress Notes (Signed)
 Seen by patient Sandra Hess on 06/11/2024  8:32 AM

## 2024-07-02 ENCOUNTER — Encounter: Payer: Self-pay | Admitting: Surgical

## 2024-07-02 ENCOUNTER — Ambulatory Visit (INDEPENDENT_AMBULATORY_CARE_PROVIDER_SITE_OTHER): Admitting: Surgical

## 2024-07-02 VITALS — BP 141/63 | HR 70 | Temp 97.6°F | Wt 140.0 lb

## 2024-07-02 DIAGNOSIS — Z9889 Other specified postprocedural states: Secondary | ICD-10-CM

## 2024-07-02 NOTE — Progress Notes (Signed)
 Patient is a 68 y.o.-year-old female status post bilateral breast reduction with Dr.  Waddell. Patient is 9 weeks postop.  Patient developed a small wound of the right T-junction and has been keeping the area clean with Vashe soaked gauze and applying Aquaphor daily.  She reports she has noticed improvement.  She is not having any issues at this time.  She is hopeful that this will heal prior to early December as she is planning a trip/cruise.  Chaperone present on exam Bilateral NAC's are viable, bilateral breast incisions are intact. There is no erythema or cellulitic changes noted. No obvious subcutaneous fluid collections noted with palpation.  She does have a small wound of the right breast T-junction that is approximately 2 x 2 mm.  There is no surrounding erythema or cellulitic changes.  No active drainage noted.  A/P:  Continue with current dressing changes, there is no signs of infection or concern on exam.  We did discuss that this will likely continue to heal on its own without intervention but we will continue to monitor.  Recommend following up in 2 weeks.  All of the patient's questions were answered to their content. Recommend calling with any questions or concerns.

## 2024-07-08 ENCOUNTER — Encounter: Payer: Self-pay | Admitting: Family Medicine

## 2024-07-08 ENCOUNTER — Ambulatory Visit (INDEPENDENT_AMBULATORY_CARE_PROVIDER_SITE_OTHER): Admitting: Family Medicine

## 2024-07-08 VITALS — BP 128/67 | HR 59 | Ht 60.5 in | Wt 139.4 lb

## 2024-07-08 DIAGNOSIS — E7800 Pure hypercholesterolemia, unspecified: Secondary | ICD-10-CM

## 2024-07-08 DIAGNOSIS — R030 Elevated blood-pressure reading, without diagnosis of hypertension: Secondary | ICD-10-CM

## 2024-07-08 DIAGNOSIS — E039 Hypothyroidism, unspecified: Secondary | ICD-10-CM

## 2024-07-08 NOTE — Progress Notes (Signed)
 Established Patient Office Visit  Introduced to nurse practitioner role and practice setting.  All questions answered.  Discussed provider/patient relationship and expectations.   Subjective   Patient ID: Sandra Hess, female    DOB: 1956/08/09  Age: 68 y.o. MRN: [id number]  Chief Complaint  Patient presents with   Medical Management of Chronic Issues    HTN Average readings at home 120's/70's   Discussed the use of AI scribe software for clinical note transcription with the patient, who gave verbal consent to proceed.  History of Present Illness Sandra Hess is a 68 year old female with recent hx of elevated blood pressure who presents for follow-up with home readings.  Her blood pressure readings at home have improved significantly, with systolic readings ranging from 103 to 128 and diastolic from 69 to 77 over the past week and a half. Initially, the readings were elevated, possibly triggered by surgery and stress related to wound healing.  Breast augmentation - follows with plastics, The surgical wound is almost closed, described as 'just a hole' now. She has been using Vashe and Aquaphor for wound care. She is concerned about the wound healing completely before her upcoming cruise in December.  Hypothyroidism -  She has been on an levothyroxine  88 mcg dose of her thyroid  medication - recent TSH mild elevation  She has experienced weight gain, which she attributes to quitting smoking. Working from home has made it challenging to manage her weight due to easy access to food.       06/10/2024    8:31 AM 12/09/2023    9:35 AM 03/13/2021    9:12 AM  Depression screen PHQ 2/9  Decreased Interest 0 0 0  Down, Depressed, Hopeless 0 0 0  PHQ - 2 Score 0 0 0  Altered sleeping 0 1 0  Tired, decreased energy 0 0 0  Change in appetite 0 0 0  Feeling bad or failure about yourself  0 0 0  Trouble concentrating 0 0 0  Moving slowly or fidgety/restless 0 0 0  Suicidal thoughts 0 0 0   PHQ-9 Score 0 1 0  Difficult doing work/chores Not difficult at all Not difficult at all Not difficult at all       06/10/2024    8:31 AM 12/09/2023    9:35 AM  GAD 7 : Generalized Anxiety Score  Nervous, Anxious, on Edge 0 0  Control/stop worrying 0 0  Worry too much - different things 0 0  Trouble relaxing 0 0  Restless 0 0  Easily annoyed or irritable 0 0  Afraid - awful might happen 0 0  Total GAD 7 Score 0 0  Anxiety Difficulty Not difficult at all Not difficult at all     ROS  Negative unless indicated in HPI   Objective:     BP 128/67 (BP Location: Left Arm, Patient Position: Sitting, Cuff Size: Normal)   Pulse (!) 59   Ht 5' 0.5 (1.537 m)   Wt 139 lb 6.4 oz (63.2 kg)   SpO2 100%   BMI 26.78 kg/m    Physical Exam Constitutional:      General: She is not in acute distress.    Appearance: Normal appearance. She is not ill-appearing, toxic-appearing or diaphoretic.  HENT:     Head: Normocephalic.     Nose: Nose normal.     Mouth/Throat:     Mouth: Mucous membranes are moist.     Pharynx: Oropharynx is clear.  Eyes:     Extraocular Movements: Extraocular movements intact.     Pupils: Pupils are equal, round, and reactive to light.  Cardiovascular:     Rate and Rhythm: Normal rate and regular rhythm.     Pulses: Normal pulses.     Heart sounds: Normal heart sounds. No murmur heard.    No friction rub. No gallop.  Pulmonary:     Effort: No respiratory distress.     Breath sounds: No stridor. No wheezing, rhonchi or rales.  Chest:     Chest wall: No tenderness.  Musculoskeletal:     Right lower leg: No edema.     Left lower leg: No edema.  Skin:    General: Skin is warm and dry.     Capillary Refill: Capillary refill takes less than 2 seconds.  Neurological:     General: No focal deficit present.     Mental Status: She is alert and oriented to person, place, and time. Mental status is at baseline.     Cranial Nerves: No cranial nerve deficit.      Motor: No weakness.     Gait: Gait normal.  Psychiatric:        Mood and Affect: Mood normal.        Behavior: Behavior normal.        Thought Content: Thought content normal.        Judgment: Judgment normal.      No results found for any visits on 07/08/24.    The 10-year ASCVD risk score (Arnett DK, et al., 2019) is: 4.5%    Assessment & Plan:  Acquired hypothyroidism -     TSH  Elevated blood pressure reading in office without diagnosis of hypertension  Hypercholesterolemia     Assessment and Plan Assessment & Plan Elevated BP w/o Diag of HTN - home readings decreasing range 100s-120s/ 70s - Improving GOAL<120/80 - continue to monitor weekly at home - DASH diet - exercise 150 minutes per week - water drink of choice - continue tobacco cessation - Keep up the great work  Hypothyroidism - previous TSH show slight elevation.  - recheck today TSH today - Maintain current levothyroxine  dose of 88 mcg, adjust as needed based on levels  Hypercholesteremia - controlled - continue crestor  5   Return in about 6 months (around 01/06/2025) for Chronic Disease mgmt.   I, Curtis DELENA Boom, FNP, have reviewed all documentation for this visit. The documentation on 07/08/24 for the exam, diagnosis, procedures, and orders are all accurate and complete.   Curtis DELENA Boom, FNP

## 2024-07-09 ENCOUNTER — Ambulatory Visit: Payer: Self-pay | Admitting: Family Medicine

## 2024-07-09 LAB — TSH: TSH: 2.32 u[IU]/mL (ref 0.450–4.500)

## 2024-07-16 ENCOUNTER — Ambulatory Visit: Admitting: Surgical

## 2024-07-16 ENCOUNTER — Encounter: Payer: Self-pay | Admitting: Surgical

## 2024-07-16 VITALS — BP 147/67 | HR 66 | Ht 60.5 in | Wt 137.0 lb

## 2024-07-16 DIAGNOSIS — Z9889 Other specified postprocedural states: Secondary | ICD-10-CM

## 2024-07-16 NOTE — Progress Notes (Signed)
 Patient is a 68 y.o.-year-old female status post bilateral breast reduction with Dr.  Waddell. Patient is 11 weeks postop.  Patient reports is overall doing well.  She has been doing thin layer of Vaseline to right breast wound.  She has noticed improvement.  She is not having any concerns.   Chaperone present on exam Bilateral NAC's are viable, bilateral breast incisions are intact. There is no erythema or cellulitic changes noted. No obvious subcutaneous fluid collections noted with palpation. She does have a small wound to the right breast, it is approximately 3 x 3 mm.   A/P:  Continue with Vaseline gauze to right breast wound daily.  Recommend following up in 2 to 3 weeks for reevaluation.  Pictures were obtained of the patient and placed in the chart with the patient's or guardian's permission.  All of the patient's questions were answered to their content. Recommend calling with any questions or concerns.

## 2024-08-05 ENCOUNTER — Ambulatory Visit: Admitting: Surgical

## 2024-08-05 VITALS — BP 147/62 | HR 66

## 2024-08-05 DIAGNOSIS — S21001D Unspecified open wound of right breast, subsequent encounter: Secondary | ICD-10-CM | POA: Diagnosis not present

## 2024-08-05 DIAGNOSIS — Z9889 Other specified postprocedural states: Secondary | ICD-10-CM

## 2024-08-05 NOTE — Progress Notes (Signed)
 Patient is a 68 year old female status post bilateral breast reduction with Dr. Waddell.  Surgery was on 04/30/2024.  Patient is just over 3 months postop.  She developed a wound of the right breast along the vertical limb.  She has been keeping the area covered with Vaseline and gauze and is tolerating this well.  She has continued to notice improvement.  She does not have any specific concerns  Chaperone present on exam Bilateral NAC's are viable, bilateral breast incisions are intact.  There is no erythema or cellulitic changes noted.  She does have a wound of the right breast that is approximately 1.5 x 1.5 mm.  There has been new epithelialization noted.  A/P:  Continue with Vaseline and gauze to the right breast wound daily.  There is no signs infection or concern on exam.  Patient would like to just follow-up as needed, she will continue to monitor the wound.  She feels comfortable doing this.  Discussed with patient that we are available at any point for reevaluation if she would like to follow-up.  Pictures were obtained of the patient and placed in the chart with the patient's or guardian's permission.

## 2024-08-16 ENCOUNTER — Other Ambulatory Visit: Payer: Self-pay | Admitting: Family Medicine

## 2024-08-17 ENCOUNTER — Ambulatory Visit (INDEPENDENT_AMBULATORY_CARE_PROVIDER_SITE_OTHER): Admitting: Family Medicine

## 2024-08-17 ENCOUNTER — Encounter: Payer: Self-pay | Admitting: Family Medicine

## 2024-08-17 VITALS — BP 129/67 | HR 56 | Resp 16 | Ht 60.0 in | Wt 136.1 lb

## 2024-08-17 DIAGNOSIS — Z79890 Hormone replacement therapy (postmenopausal): Secondary | ICD-10-CM

## 2024-08-17 MED ORDER — ESTRADIOL 0.5 MG PO TABS: 0.5000 mg | ORAL_TABLET | Freq: Every day | ORAL | 1 refills | Status: AC

## 2024-08-17 NOTE — Progress Notes (Signed)
 Established Patient Office Visit  Introduced to nurse practitioner role and practice setting.  All questions answered.  Discussed provider/patient relationship and expectations.  Subjective   Patient ID: Sandra Hess, female    DOB: 1955-12-20  Age: 68 y.o. MRN: [id number]  Chief Complaint  Patient presents with   Medication Refill    estradiol  (ESTRACE ) 1 MG tablet   Discussed the use of AI scribe software for clinical note transcription with the patient, who gave verbal consent to proceed.  History of Present Illness Sandra Hess is a 68 year old female who presents for evaluation of long-term hormone replacement therapy use.  She has been on hormone replacement therapy since 2002 following a total hysterectomy due to severe hemorrhaging and fibroids, which also caused anemia.  She is considering discontinuing HRT as she approaches 23 years of use.  She experiences hot flashes when taking the medication later in the day, which disrupts her sleep. Missing a dose sometimes alleviates these symptoms. She is currently taking estradiol  1mg  daily.  She is planning a cruise and is concerned about mood changes associated with abrupt discontinuation of HRT.       08/17/2024    9:36 AM 06/10/2024    8:31 AM 12/09/2023    9:35 AM  Depression screen PHQ 2/9  Decreased Interest 0 0 0  Down, Depressed, Hopeless 0 0 0  PHQ - 2 Score 0 0 0  Altered sleeping  0 1  Tired, decreased energy  0 0  Change in appetite  0 0  Feeling bad or failure about yourself   0 0  Trouble concentrating  0 0  Moving slowly or fidgety/restless  0 0  Suicidal thoughts  0 0  PHQ-9 Score  0  1   Difficult doing work/chores  Not difficult at all Not difficult at all     Data saved with a previous flowsheet row definition       08/17/2024    9:36 AM 06/10/2024    8:31 AM 12/09/2023    9:35 AM  GAD 7 : Generalized Anxiety Score  Nervous, Anxious, on Edge 0 0 0  Control/stop worrying 0 0 0  Worry too much  - different things 0 0 0  Trouble relaxing 0 0 0  Restless 0 0 0  Easily annoyed or irritable 0 0 0  Afraid - awful might happen 0 0 0  Total GAD 7 Score 0 0 0  Anxiety Difficulty Not difficult at all Not difficult at all Not difficult at all     ROS  Negative unless indicated in HPI   Objective:     BP 129/67 (BP Location: Left Arm, Patient Position: Sitting, Cuff Size: Normal)   Pulse (!) 56   Resp 16   Ht 5' (1.524 m)   Wt 136 lb 1.6 oz (61.7 kg)   SpO2 98%   BMI 26.58 kg/m    Physical Exam   No results found for any visits on 08/17/24.    The 10-year ASCVD risk score (Arnett DK, et al., 2019) is: 4.6%    Assessment & Plan:  Postmenopausal HRT (hormone replacement therapy) -     Estradiol ; Take 1 tablet (0.5 mg total) by mouth daily.  Dispense: 90 tablet; Refill: 1 -     Ambulatory referral to Obstetrics / Gynecology     Assessment and Plan Assessment & Plan Long-term estrogen replacement therapy management (post-hysterectomy) On estrogen replacement therapy since 2002, post-hysterectomy. Considering discontinuation due  to prolonged use and potential risks. No adverse effects currently, but risks of abrupt cessation include mood changes and vasomotor symptoms. Prefers gradual tapering to avoid withdrawal of hormones - Reduced estrogen dose to 0.5 mg daily from 1mg  daily - no uterus, therefore progesterone not needed - Referred to GYN for further evaluation and recommendations on hormone replacement therapy.  Hot flashes (vasomotor symptoms) Experiences hot flashes, particularly when taking estrogen later in the day.  - Symptoms improve when dose is missed entirely that day per patient.  - Gradual reduction of estrogen dose may help manage symptoms. - Reduced estrogen dose to 0.5 mg daily to assess impact on hot flashes. - Will consider non-hormonal options for hot flash management if symptoms persist- SSRI, gabapentin ,veozah.   Return in about 5 months  (around 01/15/2025).    Curtis DELENA Boom, FNP

## 2025-01-06 ENCOUNTER — Ambulatory Visit
